# Patient Record
Sex: Female | Born: 1940 | Race: White | Hispanic: No | Marital: Married | Smoking: Former smoker
Health system: Southern US, Community
[De-identification: ages and names within clinical notes are randomized; demographics above are authoritative.]

## PROBLEM LIST (undated history)

## (undated) DIAGNOSIS — E7800 Pure hypercholesterolemia, unspecified: Secondary | ICD-10-CM

## (undated) DIAGNOSIS — I10 Essential (primary) hypertension: Secondary | ICD-10-CM

## (undated) DIAGNOSIS — E119 Type 2 diabetes mellitus without complications: Secondary | ICD-10-CM

## (undated) HISTORY — PX: ABDOMINAL HYSTERECTOMY: SHX81

## (undated) HISTORY — PX: TONSILLECTOMY: SUR1361

---

## 2015-09-09 DIAGNOSIS — H2513 Age-related nuclear cataract, bilateral: Secondary | ICD-10-CM | POA: Diagnosis not present

## 2015-09-09 DIAGNOSIS — E119 Type 2 diabetes mellitus without complications: Secondary | ICD-10-CM | POA: Diagnosis not present

## 2015-09-21 DIAGNOSIS — E119 Type 2 diabetes mellitus without complications: Secondary | ICD-10-CM | POA: Diagnosis not present

## 2015-09-21 DIAGNOSIS — I10 Essential (primary) hypertension: Secondary | ICD-10-CM | POA: Diagnosis not present

## 2015-09-21 DIAGNOSIS — E7800 Pure hypercholesterolemia, unspecified: Secondary | ICD-10-CM | POA: Diagnosis not present

## 2015-10-22 DIAGNOSIS — G4733 Obstructive sleep apnea (adult) (pediatric): Secondary | ICD-10-CM | POA: Diagnosis not present

## 2015-10-22 DIAGNOSIS — E7800 Pure hypercholesterolemia, unspecified: Secondary | ICD-10-CM | POA: Diagnosis not present

## 2015-10-22 DIAGNOSIS — Z298 Encounter for other specified prophylactic measures: Secondary | ICD-10-CM | POA: Diagnosis not present

## 2015-10-22 DIAGNOSIS — D696 Thrombocytopenia, unspecified: Secondary | ICD-10-CM | POA: Diagnosis not present

## 2015-10-22 DIAGNOSIS — I10 Essential (primary) hypertension: Secondary | ICD-10-CM | POA: Diagnosis not present

## 2015-10-22 DIAGNOSIS — E119 Type 2 diabetes mellitus without complications: Secondary | ICD-10-CM | POA: Diagnosis not present

## 2016-01-02 DIAGNOSIS — S93401A Sprain of unspecified ligament of right ankle, initial encounter: Secondary | ICD-10-CM | POA: Diagnosis not present

## 2016-01-08 DIAGNOSIS — D709 Neutropenia, unspecified: Secondary | ICD-10-CM | POA: Diagnosis not present

## 2016-01-08 DIAGNOSIS — Z7984 Long term (current) use of oral hypoglycemic drugs: Secondary | ICD-10-CM | POA: Diagnosis not present

## 2016-01-08 DIAGNOSIS — Z79899 Other long term (current) drug therapy: Secondary | ICD-10-CM | POA: Diagnosis not present

## 2016-01-08 DIAGNOSIS — D696 Thrombocytopenia, unspecified: Secondary | ICD-10-CM | POA: Diagnosis not present

## 2016-01-08 DIAGNOSIS — D693 Immune thrombocytopenic purpura: Secondary | ICD-10-CM | POA: Diagnosis not present

## 2016-01-26 DIAGNOSIS — N611 Abscess of the breast and nipple: Secondary | ICD-10-CM | POA: Diagnosis not present

## 2016-04-15 DIAGNOSIS — Z79899 Other long term (current) drug therapy: Secondary | ICD-10-CM | POA: Diagnosis not present

## 2016-04-15 DIAGNOSIS — D751 Secondary polycythemia: Secondary | ICD-10-CM | POA: Diagnosis not present

## 2016-04-15 DIAGNOSIS — J90 Pleural effusion, not elsewhere classified: Secondary | ICD-10-CM | POA: Diagnosis not present

## 2016-04-15 DIAGNOSIS — D693 Immune thrombocytopenic purpura: Secondary | ICD-10-CM | POA: Diagnosis not present

## 2016-04-15 DIAGNOSIS — Z7984 Long term (current) use of oral hypoglycemic drugs: Secondary | ICD-10-CM | POA: Diagnosis not present

## 2016-04-15 DIAGNOSIS — R600 Localized edema: Secondary | ICD-10-CM | POA: Diagnosis not present

## 2016-05-11 DIAGNOSIS — E119 Type 2 diabetes mellitus without complications: Secondary | ICD-10-CM | POA: Diagnosis not present

## 2016-05-11 DIAGNOSIS — R600 Localized edema: Secondary | ICD-10-CM | POA: Diagnosis not present

## 2016-05-11 DIAGNOSIS — I10 Essential (primary) hypertension: Secondary | ICD-10-CM | POA: Diagnosis not present

## 2016-05-11 DIAGNOSIS — D1801 Hemangioma of skin and subcutaneous tissue: Secondary | ICD-10-CM | POA: Diagnosis not present

## 2016-05-11 DIAGNOSIS — R0600 Dyspnea, unspecified: Secondary | ICD-10-CM | POA: Diagnosis not present

## 2016-05-14 DIAGNOSIS — J9811 Atelectasis: Secondary | ICD-10-CM | POA: Diagnosis not present

## 2016-05-14 DIAGNOSIS — R918 Other nonspecific abnormal finding of lung field: Secondary | ICD-10-CM | POA: Diagnosis not present

## 2016-06-10 DIAGNOSIS — L821 Other seborrheic keratosis: Secondary | ICD-10-CM | POA: Diagnosis not present

## 2016-06-10 DIAGNOSIS — D492 Neoplasm of unspecified behavior of bone, soft tissue, and skin: Secondary | ICD-10-CM | POA: Diagnosis not present

## 2016-06-10 DIAGNOSIS — L900 Lichen sclerosus et atrophicus: Secondary | ICD-10-CM | POA: Diagnosis not present

## 2016-07-05 DIAGNOSIS — L900 Lichen sclerosus et atrophicus: Secondary | ICD-10-CM | POA: Diagnosis not present

## 2016-07-23 DIAGNOSIS — Z79899 Other long term (current) drug therapy: Secondary | ICD-10-CM | POA: Diagnosis not present

## 2016-07-23 DIAGNOSIS — D693 Immune thrombocytopenic purpura: Secondary | ICD-10-CM | POA: Diagnosis not present

## 2016-07-23 DIAGNOSIS — D709 Neutropenia, unspecified: Secondary | ICD-10-CM | POA: Diagnosis not present

## 2016-07-23 DIAGNOSIS — D751 Secondary polycythemia: Secondary | ICD-10-CM | POA: Diagnosis not present

## 2016-07-23 DIAGNOSIS — R600 Localized edema: Secondary | ICD-10-CM | POA: Diagnosis not present

## 2016-07-23 DIAGNOSIS — Z7984 Long term (current) use of oral hypoglycemic drugs: Secondary | ICD-10-CM | POA: Diagnosis not present

## 2016-08-23 DIAGNOSIS — L900 Lichen sclerosus et atrophicus: Secondary | ICD-10-CM | POA: Diagnosis not present

## 2016-10-22 DIAGNOSIS — L304 Erythema intertrigo: Secondary | ICD-10-CM | POA: Diagnosis not present

## 2016-10-22 DIAGNOSIS — Z0000 Encounter for general adult medical examination without abnormal findings: Secondary | ICD-10-CM | POA: Diagnosis not present

## 2016-10-22 DIAGNOSIS — I10 Essential (primary) hypertension: Secondary | ICD-10-CM | POA: Diagnosis not present

## 2016-10-22 DIAGNOSIS — E7800 Pure hypercholesterolemia, unspecified: Secondary | ICD-10-CM | POA: Diagnosis not present

## 2016-10-22 DIAGNOSIS — D696 Thrombocytopenia, unspecified: Secondary | ICD-10-CM | POA: Diagnosis not present

## 2016-10-22 DIAGNOSIS — E119 Type 2 diabetes mellitus without complications: Secondary | ICD-10-CM | POA: Diagnosis not present

## 2016-10-22 DIAGNOSIS — G4733 Obstructive sleep apnea (adult) (pediatric): Secondary | ICD-10-CM | POA: Diagnosis not present

## 2016-11-11 DIAGNOSIS — H18413 Arcus senilis, bilateral: Secondary | ICD-10-CM | POA: Diagnosis not present

## 2016-11-11 DIAGNOSIS — H02839 Dermatochalasis of unspecified eye, unspecified eyelid: Secondary | ICD-10-CM | POA: Diagnosis not present

## 2016-11-11 DIAGNOSIS — H2512 Age-related nuclear cataract, left eye: Secondary | ICD-10-CM | POA: Diagnosis not present

## 2016-11-11 DIAGNOSIS — H2513 Age-related nuclear cataract, bilateral: Secondary | ICD-10-CM | POA: Diagnosis not present

## 2016-11-11 DIAGNOSIS — H40003 Preglaucoma, unspecified, bilateral: Secondary | ICD-10-CM | POA: Diagnosis not present

## 2016-11-11 DIAGNOSIS — I10 Essential (primary) hypertension: Secondary | ICD-10-CM | POA: Diagnosis not present

## 2016-12-10 DIAGNOSIS — L900 Lichen sclerosus et atrophicus: Secondary | ICD-10-CM | POA: Diagnosis not present

## 2016-12-17 DIAGNOSIS — Z1231 Encounter for screening mammogram for malignant neoplasm of breast: Secondary | ICD-10-CM | POA: Diagnosis not present

## 2017-01-04 DIAGNOSIS — L900 Lichen sclerosus et atrophicus: Secondary | ICD-10-CM | POA: Diagnosis not present

## 2017-01-04 DIAGNOSIS — L820 Inflamed seborrheic keratosis: Secondary | ICD-10-CM | POA: Diagnosis not present

## 2017-01-04 DIAGNOSIS — D485 Neoplasm of uncertain behavior of skin: Secondary | ICD-10-CM | POA: Diagnosis not present

## 2017-01-21 DIAGNOSIS — E119 Type 2 diabetes mellitus without complications: Secondary | ICD-10-CM | POA: Diagnosis not present

## 2017-02-01 DIAGNOSIS — L239 Allergic contact dermatitis, unspecified cause: Secondary | ICD-10-CM | POA: Diagnosis not present

## 2017-02-03 DIAGNOSIS — M545 Low back pain: Secondary | ICD-10-CM | POA: Diagnosis not present

## 2017-02-03 DIAGNOSIS — W19XXXA Unspecified fall, initial encounter: Secondary | ICD-10-CM | POA: Diagnosis not present

## 2017-02-03 DIAGNOSIS — E119 Type 2 diabetes mellitus without complications: Secondary | ICD-10-CM | POA: Diagnosis not present

## 2017-02-03 DIAGNOSIS — I10 Essential (primary) hypertension: Secondary | ICD-10-CM | POA: Diagnosis not present

## 2017-02-03 DIAGNOSIS — S0993XA Unspecified injury of face, initial encounter: Secondary | ICD-10-CM | POA: Diagnosis not present

## 2017-02-03 DIAGNOSIS — G319 Degenerative disease of nervous system, unspecified: Secondary | ICD-10-CM | POA: Diagnosis not present

## 2017-02-03 DIAGNOSIS — S0003XA Contusion of scalp, initial encounter: Secondary | ICD-10-CM | POA: Diagnosis not present

## 2017-02-03 DIAGNOSIS — S0990XA Unspecified injury of head, initial encounter: Secondary | ICD-10-CM | POA: Diagnosis not present

## 2017-02-03 DIAGNOSIS — S098XXA Other specified injuries of head, initial encounter: Secondary | ICD-10-CM | POA: Diagnosis not present

## 2017-02-03 DIAGNOSIS — Z882 Allergy status to sulfonamides status: Secondary | ICD-10-CM | POA: Diagnosis not present

## 2017-02-03 DIAGNOSIS — S300XXA Contusion of lower back and pelvis, initial encounter: Secondary | ICD-10-CM | POA: Diagnosis not present

## 2017-02-04 DIAGNOSIS — N611 Abscess of the breast and nipple: Secondary | ICD-10-CM | POA: Diagnosis not present

## 2017-02-04 DIAGNOSIS — D485 Neoplasm of uncertain behavior of skin: Secondary | ICD-10-CM | POA: Diagnosis not present

## 2017-02-09 DIAGNOSIS — M79671 Pain in right foot: Secondary | ICD-10-CM | POA: Diagnosis not present

## 2017-02-09 DIAGNOSIS — M25571 Pain in right ankle and joints of right foot: Secondary | ICD-10-CM | POA: Diagnosis not present

## 2017-02-14 DIAGNOSIS — K5730 Diverticulosis of large intestine without perforation or abscess without bleeding: Secondary | ICD-10-CM | POA: Diagnosis not present

## 2017-02-14 DIAGNOSIS — Z86010 Personal history of colonic polyps: Secondary | ICD-10-CM | POA: Diagnosis not present

## 2017-02-14 DIAGNOSIS — Z1211 Encounter for screening for malignant neoplasm of colon: Secondary | ICD-10-CM | POA: Diagnosis not present

## 2017-02-14 DIAGNOSIS — K648 Other hemorrhoids: Secondary | ICD-10-CM | POA: Diagnosis not present

## 2017-02-15 DIAGNOSIS — M659 Synovitis and tenosynovitis, unspecified: Secondary | ICD-10-CM | POA: Diagnosis not present

## 2017-02-15 DIAGNOSIS — Z6836 Body mass index (BMI) 36.0-36.9, adult: Secondary | ICD-10-CM | POA: Diagnosis not present

## 2017-02-15 DIAGNOSIS — R600 Localized edema: Secondary | ICD-10-CM | POA: Diagnosis not present

## 2017-02-15 DIAGNOSIS — M25571 Pain in right ankle and joints of right foot: Secondary | ICD-10-CM | POA: Diagnosis not present

## 2017-02-16 DIAGNOSIS — W010XXD Fall on same level from slipping, tripping and stumbling without subsequent striking against object, subsequent encounter: Secondary | ICD-10-CM | POA: Diagnosis not present

## 2017-02-16 DIAGNOSIS — S8991XD Unspecified injury of right lower leg, subsequent encounter: Secondary | ICD-10-CM | POA: Diagnosis not present

## 2017-02-16 DIAGNOSIS — D693 Immune thrombocytopenic purpura: Secondary | ICD-10-CM | POA: Diagnosis not present

## 2017-02-16 DIAGNOSIS — I10 Essential (primary) hypertension: Secondary | ICD-10-CM | POA: Diagnosis not present

## 2017-02-16 DIAGNOSIS — Z79899 Other long term (current) drug therapy: Secondary | ICD-10-CM | POA: Diagnosis not present

## 2017-02-16 DIAGNOSIS — Z6838 Body mass index (BMI) 38.0-38.9, adult: Secondary | ICD-10-CM | POA: Diagnosis not present

## 2017-02-16 DIAGNOSIS — S3992XD Unspecified injury of lower back, subsequent encounter: Secondary | ICD-10-CM | POA: Diagnosis not present

## 2017-02-16 DIAGNOSIS — Z7984 Long term (current) use of oral hypoglycemic drugs: Secondary | ICD-10-CM | POA: Diagnosis not present

## 2017-02-16 DIAGNOSIS — E119 Type 2 diabetes mellitus without complications: Secondary | ICD-10-CM | POA: Diagnosis not present

## 2017-02-17 DIAGNOSIS — L88 Pyoderma gangrenosum: Secondary | ICD-10-CM | POA: Diagnosis not present

## 2017-02-23 DIAGNOSIS — H2512 Age-related nuclear cataract, left eye: Secondary | ICD-10-CM | POA: Diagnosis not present

## 2017-02-23 DIAGNOSIS — E785 Hyperlipidemia, unspecified: Secondary | ICD-10-CM | POA: Diagnosis not present

## 2017-02-23 DIAGNOSIS — Z79899 Other long term (current) drug therapy: Secondary | ICD-10-CM | POA: Diagnosis not present

## 2017-02-23 DIAGNOSIS — I10 Essential (primary) hypertension: Secondary | ICD-10-CM | POA: Diagnosis not present

## 2017-02-23 DIAGNOSIS — E119 Type 2 diabetes mellitus without complications: Secondary | ICD-10-CM | POA: Diagnosis not present

## 2017-02-23 DIAGNOSIS — Z7984 Long term (current) use of oral hypoglycemic drugs: Secondary | ICD-10-CM | POA: Diagnosis not present

## 2017-02-23 DIAGNOSIS — Z87891 Personal history of nicotine dependence: Secondary | ICD-10-CM | POA: Diagnosis not present

## 2017-02-23 DIAGNOSIS — Z882 Allergy status to sulfonamides status: Secondary | ICD-10-CM | POA: Diagnosis not present

## 2017-02-24 DIAGNOSIS — M659 Synovitis and tenosynovitis, unspecified: Secondary | ICD-10-CM | POA: Diagnosis not present

## 2017-02-28 DIAGNOSIS — Z6838 Body mass index (BMI) 38.0-38.9, adult: Secondary | ICD-10-CM | POA: Diagnosis not present

## 2017-02-28 DIAGNOSIS — M659 Synovitis and tenosynovitis, unspecified: Secondary | ICD-10-CM | POA: Diagnosis not present

## 2017-03-14 DIAGNOSIS — M25471 Effusion, right ankle: Secondary | ICD-10-CM | POA: Diagnosis not present

## 2017-03-14 DIAGNOSIS — M659 Synovitis and tenosynovitis, unspecified: Secondary | ICD-10-CM | POA: Diagnosis not present

## 2017-03-14 DIAGNOSIS — R937 Abnormal findings on diagnostic imaging of other parts of musculoskeletal system: Secondary | ICD-10-CM | POA: Diagnosis not present

## 2017-03-14 DIAGNOSIS — M25472 Effusion, left ankle: Secondary | ICD-10-CM | POA: Diagnosis not present

## 2017-03-14 DIAGNOSIS — M65872 Other synovitis and tenosynovitis, left ankle and foot: Secondary | ICD-10-CM | POA: Diagnosis not present

## 2017-03-16 DIAGNOSIS — Z882 Allergy status to sulfonamides status: Secondary | ICD-10-CM | POA: Diagnosis not present

## 2017-03-16 DIAGNOSIS — H25011 Cortical age-related cataract, right eye: Secondary | ICD-10-CM | POA: Diagnosis not present

## 2017-03-16 DIAGNOSIS — Z9842 Cataract extraction status, left eye: Secondary | ICD-10-CM | POA: Diagnosis not present

## 2017-03-16 DIAGNOSIS — Z87891 Personal history of nicotine dependence: Secondary | ICD-10-CM | POA: Diagnosis not present

## 2017-03-16 DIAGNOSIS — H2511 Age-related nuclear cataract, right eye: Secondary | ICD-10-CM | POA: Diagnosis not present

## 2017-03-16 DIAGNOSIS — Z79899 Other long term (current) drug therapy: Secondary | ICD-10-CM | POA: Diagnosis not present

## 2017-03-16 DIAGNOSIS — Z7984 Long term (current) use of oral hypoglycemic drugs: Secondary | ICD-10-CM | POA: Diagnosis not present

## 2017-03-16 DIAGNOSIS — I10 Essential (primary) hypertension: Secondary | ICD-10-CM | POA: Diagnosis not present

## 2017-03-16 DIAGNOSIS — E119 Type 2 diabetes mellitus without complications: Secondary | ICD-10-CM | POA: Diagnosis not present

## 2017-03-16 DIAGNOSIS — Z961 Presence of intraocular lens: Secondary | ICD-10-CM | POA: Diagnosis not present

## 2017-03-16 DIAGNOSIS — E785 Hyperlipidemia, unspecified: Secondary | ICD-10-CM | POA: Diagnosis not present

## 2017-03-24 DIAGNOSIS — L900 Lichen sclerosus et atrophicus: Secondary | ICD-10-CM | POA: Diagnosis not present

## 2017-04-13 DIAGNOSIS — I10 Essential (primary) hypertension: Secondary | ICD-10-CM | POA: Diagnosis not present

## 2017-04-13 DIAGNOSIS — R0602 Shortness of breath: Secondary | ICD-10-CM | POA: Diagnosis not present

## 2017-04-13 DIAGNOSIS — R600 Localized edema: Secondary | ICD-10-CM | POA: Diagnosis not present

## 2017-04-25 DIAGNOSIS — R600 Localized edema: Secondary | ICD-10-CM | POA: Diagnosis not present

## 2017-04-25 DIAGNOSIS — E119 Type 2 diabetes mellitus without complications: Secondary | ICD-10-CM | POA: Diagnosis not present

## 2017-04-25 DIAGNOSIS — I10 Essential (primary) hypertension: Secondary | ICD-10-CM | POA: Diagnosis not present

## 2017-05-05 ENCOUNTER — Encounter (HOSPITAL_COMMUNITY): Payer: Self-pay | Admitting: Emergency Medicine

## 2017-05-05 ENCOUNTER — Inpatient Hospital Stay (HOSPITAL_COMMUNITY)
Admission: EM | Admit: 2017-05-05 | Discharge: 2017-05-12 | DRG: 291 | Disposition: A | Discharge status: Skilled Nursing Facility | Payer: PPO | Attending: Internal Medicine | Admitting: Internal Medicine

## 2017-05-05 ENCOUNTER — Emergency Department (HOSPITAL_COMMUNITY): Payer: PPO

## 2017-05-05 ENCOUNTER — Inpatient Hospital Stay (HOSPITAL_COMMUNITY): Payer: PPO

## 2017-05-05 DIAGNOSIS — R748 Abnormal levels of other serum enzymes: Secondary | ICD-10-CM

## 2017-05-05 DIAGNOSIS — Z87891 Personal history of nicotine dependence: Secondary | ICD-10-CM | POA: Diagnosis not present

## 2017-05-05 DIAGNOSIS — E119 Type 2 diabetes mellitus without complications: Secondary | ICD-10-CM

## 2017-05-05 DIAGNOSIS — E1169 Type 2 diabetes mellitus with other specified complication: Secondary | ICD-10-CM

## 2017-05-05 DIAGNOSIS — E7800 Pure hypercholesterolemia, unspecified: Secondary | ICD-10-CM | POA: Diagnosis present

## 2017-05-05 DIAGNOSIS — I5031 Acute diastolic (congestive) heart failure: Secondary | ICD-10-CM | POA: Diagnosis not present

## 2017-05-05 DIAGNOSIS — Z7984 Long term (current) use of oral hypoglycemic drugs: Secondary | ICD-10-CM

## 2017-05-05 DIAGNOSIS — J9600 Acute respiratory failure, unspecified whether with hypoxia or hypercapnia: Secondary | ICD-10-CM | POA: Diagnosis not present

## 2017-05-05 DIAGNOSIS — D693 Immune thrombocytopenic purpura: Secondary | ICD-10-CM | POA: Diagnosis present

## 2017-05-05 DIAGNOSIS — R791 Abnormal coagulation profile: Secondary | ICD-10-CM | POA: Diagnosis not present

## 2017-05-05 DIAGNOSIS — K766 Portal hypertension: Secondary | ICD-10-CM | POA: Diagnosis present

## 2017-05-05 DIAGNOSIS — J9601 Acute respiratory failure with hypoxia: Secondary | ICD-10-CM | POA: Diagnosis not present

## 2017-05-05 DIAGNOSIS — R0602 Shortness of breath: Secondary | ICD-10-CM | POA: Diagnosis not present

## 2017-05-05 DIAGNOSIS — E118 Type 2 diabetes mellitus with unspecified complications: Secondary | ICD-10-CM

## 2017-05-05 DIAGNOSIS — Z6837 Body mass index (BMI) 37.0-37.9, adult: Secondary | ICD-10-CM

## 2017-05-05 DIAGNOSIS — E876 Hypokalemia: Secondary | ICD-10-CM | POA: Diagnosis not present

## 2017-05-05 DIAGNOSIS — E669 Obesity, unspecified: Secondary | ICD-10-CM | POA: Diagnosis not present

## 2017-05-05 DIAGNOSIS — R778 Other specified abnormalities of plasma proteins: Secondary | ICD-10-CM

## 2017-05-05 DIAGNOSIS — I152 Hypertension secondary to endocrine disorders: Secondary | ICD-10-CM

## 2017-05-05 DIAGNOSIS — I700 Atherosclerosis of aorta: Secondary | ICD-10-CM | POA: Diagnosis not present

## 2017-05-05 DIAGNOSIS — I509 Heart failure, unspecified: Secondary | ICD-10-CM | POA: Diagnosis not present

## 2017-05-05 DIAGNOSIS — K7460 Unspecified cirrhosis of liver: Secondary | ICD-10-CM | POA: Diagnosis present

## 2017-05-05 DIAGNOSIS — E1159 Type 2 diabetes mellitus with other circulatory complications: Secondary | ICD-10-CM

## 2017-05-05 DIAGNOSIS — Z79899 Other long term (current) drug therapy: Secondary | ICD-10-CM | POA: Diagnosis not present

## 2017-05-05 DIAGNOSIS — R7989 Other specified abnormal findings of blood chemistry: Secondary | ICD-10-CM | POA: Diagnosis present

## 2017-05-05 DIAGNOSIS — R0902 Hypoxemia: Secondary | ICD-10-CM | POA: Diagnosis not present

## 2017-05-05 DIAGNOSIS — R188 Other ascites: Secondary | ICD-10-CM | POA: Diagnosis not present

## 2017-05-05 DIAGNOSIS — R278 Other lack of coordination: Secondary | ICD-10-CM | POA: Diagnosis not present

## 2017-05-05 DIAGNOSIS — I110 Hypertensive heart disease with heart failure: Secondary | ICD-10-CM | POA: Diagnosis not present

## 2017-05-05 DIAGNOSIS — K7290 Hepatic failure, unspecified without coma: Secondary | ICD-10-CM | POA: Diagnosis not present

## 2017-05-05 DIAGNOSIS — I10 Essential (primary) hypertension: Secondary | ICD-10-CM | POA: Diagnosis not present

## 2017-05-05 DIAGNOSIS — R609 Edema, unspecified: Secondary | ICD-10-CM

## 2017-05-05 DIAGNOSIS — I5040 Unspecified combined systolic (congestive) and diastolic (congestive) heart failure: Secondary | ICD-10-CM | POA: Diagnosis not present

## 2017-05-05 DIAGNOSIS — K760 Fatty (change of) liver, not elsewhere classified: Secondary | ICD-10-CM | POA: Diagnosis present

## 2017-05-05 DIAGNOSIS — R601 Generalized edema: Secondary | ICD-10-CM | POA: Diagnosis not present

## 2017-05-05 DIAGNOSIS — I5033 Acute on chronic diastolic (congestive) heart failure: Secondary | ICD-10-CM | POA: Diagnosis not present

## 2017-05-05 DIAGNOSIS — R2681 Unsteadiness on feet: Secondary | ICD-10-CM | POA: Diagnosis not present

## 2017-05-05 DIAGNOSIS — M6281 Muscle weakness (generalized): Secondary | ICD-10-CM | POA: Diagnosis not present

## 2017-05-05 HISTORY — DX: Pure hypercholesterolemia, unspecified: E78.00

## 2017-05-05 HISTORY — DX: Essential (primary) hypertension: I10

## 2017-05-05 HISTORY — DX: Type 2 diabetes mellitus without complications: E11.9

## 2017-05-05 LAB — CBC WITH DIFFERENTIAL/PLATELET
Basophils Absolute: 0.1 K/uL (ref 0.0–0.1)
Basophils Relative: 1 %
Eosinophils Absolute: 0.2 K/uL (ref 0.0–0.7)
Eosinophils Relative: 3 %
HCT: 44.6 % (ref 36.0–46.0)
Hemoglobin: 15.5 g/dL — ABNORMAL HIGH (ref 12.0–15.0)
Lymphocytes Relative: 22 %
Lymphs Abs: 1.5 K/uL (ref 0.7–4.0)
MCH: 36.4 pg — ABNORMAL HIGH (ref 26.0–34.0)
MCHC: 34.8 g/dL (ref 30.0–36.0)
MCV: 104.7 fL — ABNORMAL HIGH (ref 78.0–100.0)
Monocytes Absolute: 1 K/uL (ref 0.1–1.0)
Monocytes Relative: 15 %
Neutro Abs: 3.8 K/uL (ref 1.7–7.7)
Neutrophils Relative %: 59 %
Platelets: 91 K/uL — ABNORMAL LOW (ref 150–400)
RBC: 4.26 MIL/uL (ref 3.87–5.11)
RDW: 16.8 % — ABNORMAL HIGH (ref 11.5–15.5)
WBC: 6.6 K/uL (ref 4.0–10.5)

## 2017-05-05 LAB — TROPONIN I: Troponin I: 0.04 ng/mL

## 2017-05-05 LAB — COMPREHENSIVE METABOLIC PANEL WITH GFR
ALT: 59 U/L — ABNORMAL HIGH (ref 14–54)
AST: 87 U/L — ABNORMAL HIGH (ref 15–41)
Albumin: 2.9 g/dL — ABNORMAL LOW (ref 3.5–5.0)
Alkaline Phosphatase: 81 U/L (ref 38–126)
Anion gap: 10 (ref 5–15)
BUN: 12 mg/dL (ref 6–20)
CO2: 28 mmol/L (ref 22–32)
Calcium: 8.9 mg/dL (ref 8.9–10.3)
Chloride: 96 mmol/L — ABNORMAL LOW (ref 101–111)
Creatinine, Ser: 0.7 mg/dL (ref 0.44–1.00)
GFR calc Af Amer: >60 mL/min
GFR calc non Af Amer: >60 mL/min
Glucose, Bld: 131 mg/dL — ABNORMAL HIGH (ref 65–99)
Potassium: 3.9 mmol/L (ref 3.5–5.1)
Sodium: 134 mmol/L — ABNORMAL LOW (ref 135–145)
Total Bilirubin: 2.3 mg/dL — ABNORMAL HIGH (ref 0.3–1.2)
Total Protein: 7.2 g/dL (ref 6.5–8.1)

## 2017-05-05 LAB — I-STAT BETA HCG BLOOD, ED (MC, WL, AP ONLY): I-stat hCG, quantitative: <5 m[IU]/mL (ref <5)

## 2017-05-05 LAB — BRAIN NATRIURETIC PEPTIDE: B NATRIURETIC PEPTIDE 5: 43.1 pg/mL (ref 0.0–100.0)

## 2017-05-05 LAB — D-DIMER, QUANTITATIVE (NOT AT ARMC): D DIMER QUANT: 11.49 ug{FEU}/mL — AB (ref 0.00–0.50)

## 2017-05-05 MED ORDER — ASPIRIN 81 MG PO CHEW
324.0000 mg | CHEWABLE_TABLET | Freq: Once | ORAL | Status: AC
  Administered 2017-05-05: 324 mg via ORAL
  Filled 2017-05-05: qty 4

## 2017-05-05 MED ORDER — FUROSEMIDE 10 MG/ML IJ SOLN
40.0000 mg | Freq: Two times a day (BID) | INTRAMUSCULAR | Status: DC
  Administered 2017-05-05 – 2017-05-09 (×8): 40 mg via INTRAVENOUS
  Filled 2017-05-05 (×7): qty 4

## 2017-05-05 MED ORDER — IOPAMIDOL (ISOVUE-370) INJECTION 76%
INTRAVENOUS | Status: AC
  Administered 2017-05-05: 100 mL
  Filled 2017-05-05: qty 100

## 2017-05-05 NOTE — ED Notes (Signed)
ED Provider at bedside. 

## 2017-05-05 NOTE — ED Notes (Signed)
Patient transported to CT 

## 2017-05-05 NOTE — ED Triage Notes (Signed)
Patient c/o SOB with exertion and bilat leg swelling for 2 weeks.  Patient denies any CHF PMH.

## 2017-05-05 NOTE — H&P (Addendum)
History and Physical    Brittany Reid BOF:[id number] DOB: 1941-04-01 DOA: 05/05/2017  I have briefly reviewed the patient's prior medical records in [place]  PCP: Drake Leach, MD  Patient coming from: home  Chief Complaint: Leg swelling/shortness of breath  HPI: Brittany Reid is a 76 y.o. female with medical history significant of hypertension, diabetes mellitus on oral hypoglycemics, chronic ITP, who presents to the emergency room with chief complaint of leg swelling and progressive shortness of breath over the last 2 weeks.  Patient denies having symptoms like this in the past.  She states that she gained about 10 pounds over a short period of time, and she has found herself being able to do fewer and fewer things without getting short of breath.  She denies any chest pain or palpitations.  She denies any history of heart failure or cardiac disease.  She also has noticed progressive abdominal swelling over the same period of time.  She denies any history of liver disease.  She denies any cough, fever or chills.  She has no abdominal pain, no nausea vomiting or diarrhea.  ED Course: In the emergency room, patient's vital signs are stable, she is afebrile, normotensive, was desatting on room air requiring oxygen supplementation.  Blood work reveals slightly elevated LFTs with a total bilirubin of 2.3, AST 87, ALT 59.  It also showed a normal BNP of 43.1.  Troponin is borderline at 0.04.  Platelets are low at 91.  TRH is asked for admission for concern for CHF, new onset  Review of Systems: As per HPI otherwise 10 point review of systems negative.   Past Medical History:  Diagnosis Date  . Diabetes mellitus without complication ([place])   . High cholesterol   . Hypertension     Past Surgical History:  Procedure Laterality Date  . ABDOMINAL HYSTERECTOMY    . TONSILLECTOMY       reports that she has quit smoking. She has never used smokeless tobacco. She reports that she does not  drink alcohol. Her drug history is not on file.  Allergies  Allergen Reactions  . Sulfur Diarrhea and Nausea And Vomiting   Family history Reviewed, no pertinent past family history  Prior to Admission medications   Medication Sig Start Date End Date Taking? Authorizing Provider  amitriptyline (ELAVIL) 25 MG tablet Take 50 mg by mouth at bedtime.   Yes [provider]  hydrochlorothiazide (HYDRODIURIL) 25 MG tablet Take 25 mg by mouth daily.   Yes [provider]  metFORMIN (GLUCOPHAGE-XR) 500 MG 24 hr tablet Take 1,000 mg by mouth daily.   Yes [provider]  metoprolol tartrate (LOPRESSOR) 50 MG tablet Take 25 mg by mouth 2 (two) times daily.   Yes [provider]  simvastatin (ZOCOR) 20 MG tablet Take 20 mg by mouth daily.   Yes [provider]    Physical Exam: Vitals:   05/05/17 1220 05/05/17 1301 05/05/17 1400 05/05/17 1444  BP:  (!) 145/73 135/74 126/63  Pulse:  95 93 93  Resp:  16 18 16   Temp:      TempSrc:      SpO2: 94% 92% 93% 92%  Weight:      Height:          Constitutional: NAD, calm, comfortable Eyes: PERRL, lids and conjunctivae normal ENMT: Mucous membranes are moist.   Neck: normal, supple,  Respiratory: Moves air well, no wheezing, faint bibasilar crackles.  Normal respiratory effort.  No accessory muscle use Cardiovascular: Regular rate and rhythm, no murmurs / rubs / gallops.  2+ pitting extremity edema. 2+ pedal pulses.  Abdomen: Obese, slightly distended, no tenderness, no masses palpated. Bowel sounds positive.  Musculoskeletal: no clubbing / cyanosis. Normal muscle tone.  Skin: no rashes, lesions, ulcers. No induration Neurologic: CN 2-12 grossly intact. Strength 5/5 in all 4.  Psychiatric: Normal judgment and insight. Alert and oriented x 3. Normal mood.   Labs on Admission: I have personally reviewed following labs and imaging studies  CBC:  Recent Labs Lab 05/05/17 1256  WBC 6.6  NEUTROABS  3.8  HGB 15.5*  HCT 44.6  MCV 104.7*  PLT 91*   Basic Metabolic Panel:  Recent Labs Lab 05/05/17 1256  NA 134*  K 3.9  CL 96*  CO2 28  GLUCOSE 131*  BUN 12  CREATININE 0.70  CALCIUM 8.9   GFR: Estimated Creatinine Clearance: 69.7 mL/min (by C-G formula based on SCr of 0.7 mg/dL). Liver Function Tests:  Recent Labs Lab 05/05/17 1256  AST 87*  ALT 59*  ALKPHOS 81  BILITOT 2.3*  PROT 7.2  ALBUMIN 2.9*   No results for input(s): LIPASE, AMYLASE in the last 168 hours. No results for input(s): AMMONIA in the last 168 hours. Coagulation Profile: No results for input(s): INR, PROTIME in the last 168 hours. Cardiac Enzymes:  Recent Labs Lab 05/05/17 1256  TROPONINI 0.04*   BNP (last 3 results) No results for input(s): PROBNP in the last 8760 hours. HbA1C: No results for input(s): HGBA1C in the last 72 hours. CBG: No results for input(s): GLUCAP in the last 168 hours. Lipid Profile: No results for input(s): CHOL, HDL, LDLCALC, TRIG, CHOLHDL, LDLDIRECT in the last 72 hours. Thyroid Function Tests: No results for input(s): TSH, T4TOTAL, FREET4, T3FREE, THYROIDAB in the last 72 hours. Anemia Panel: No results for input(s): VITAMINB12, FOLATE, FERRITIN, TIBC, IRON, RETICCTPCT in the last 72 hours. Urine analysis: No results found for: COLORURINE, APPEARANCEUR, [place], [place], GLUCOSEU, [place], BILIRUBINUR, KETONESUR, PROTEINUR, UROBILINOGEN, NITRITE, LEUKOCYTESUR   Radiological Exams on Admission: Dg Chest 2 View  Result Date: 05/05/2017 CLINICAL DATA:  Shortness of breath with exertion, BILATERAL leg swelling for 2 weeks, former smoker, diabetes mellitus, hypertension EXAM: CHEST  2 VIEW COMPARISON:  05/14/2016 FINDINGS: Upper normal heart size. Mediastinal contours and pulmonary vascularity normal. Bibasilar atelectasis with decreased lung volumes versus previous study. Atherosclerotic calcification aortic arch. No definite infiltrate, pleural effusion or  pneumothorax. Bones demineralized with scattered degenerative disc disease changes of the thoracic spine and BILATERAL AC joint degenerative changes. IMPRESSION: Decreased lung volumes with bibasilar atelectasis. Electronically Signed   By: Lavonia Dana M.D.   On: 05/05/2017 13:40    EKG: Independently reviewed.  Sinus rhythm  Assessment/Plan Active Problems:   CHF (congestive heart failure) (HCC)   Diabetes mellitus (HCC)   HTN (hypertension)   Obesity, diabetes, and hypertension syndrome (HCC)   Chronic ITP (idiopathic thrombocytopenia) (HCC)   Elevated liver enzymes   Acute CHF /anasarca -Patient clinically looks fluid overloaded with bilateral lower extremity pitting edema, abdominal swelling suggesting ascites -Admit on heart failure pathway, start IV Lasix 40 mg every 12 hours, strict ins and outs, daily weights -Continue home beta-blocker, hold ACE inhibitor for now until we know more why she is so fluid overloaded  Acute hypoxic respiratory failure -PE is on differential for hypoxia/shortness of breath as her CXR wasn't that obvious for pulmonary edema.  D dimer extremely elevated, obtain CT angio  Elevated  liver enzymes -Patient has slightly elevation in her AST/ALT/bilirubin, her albumin is slightly low and she is thrombocytopenic.  Concern for undiagnosed liver disease, obtain right upper quadrant ultrasound  Chronic ITP -Followed by hematology as an outpatient, I do not see prior platelet values.  If she has liver disease may also contribute to thrombocytopenia  Type 2 diabetes mellitus -Hold metformin for now, place patient on sliding scale  Hypertension -Resume home metoprolol   DVT prophylaxis: Lovenox Code Status: Full code Family Communication: Husband and daughter at bedside Disposition Plan: Admit to telemetry, likely home when ready Consults called: None    Admission status: Inpatient  At the time of admission, it appears that the appropriate admission  status for this patient is INPATIENT. This is judged to be reasonable and necessary in order to provide the required high service intensity to ensure the patient's safety given the presenting symptoms, physical exam findings, and initial radiographic and laboratory data in the context of their chronic comorbidities. Current circumstances are significant fluid overload due to probable CHF, requiring IV diuresis, and it is felt to place patient at high risk for further clinical deterioration threatening life, limb, or organ. Moreover, it is my clinical judgment that the patient will require inpatient hospital care spanning beyond 2 midnights from the point of admission and that early discharge would result in unnecessary risk of decompensation and readmission or threat to life, limb or bodily function.   Marzetta Board, MD [facility] Pager [phone]  If 7PM-7AM, please contact night-coverage [email or web address] Password [email or web address]  05/05/2017, 4:24 PM

## 2017-05-05 NOTE — ED Notes (Signed)
Date and time results received: 05/05/17  (use smartphrase ".now" to insert current time  Test: d -dimer Critical Value: 11.49  Name of Provider Notified: Hospitalist  Orders Received? Or Actions Taken?: see orders

## 2017-05-05 NOTE — ED Notes (Signed)
HOSPITALIST Provider at bedside. 

## 2017-05-05 NOTE — ED Provider Notes (Signed)
[facility] DEPT Provider Note   CSN: [id number] Arrival date & time: 05/05/17  1201     History   Chief Complaint Chief Complaint  Patient presents with  . Shortness of Breath  . Leg Swelling    HPI Brittany Reid Current is a 76 y.o. female.  HPI   76 year old female with a history of diabetes, hypertension and hyperlipidemia presents with concern for progressive dyspnea over 3 weeks and bilateral leg swelling.  Reports that it's been progressive over the last couple weeks. Reports orthopnea where she requires 2 pillows to sleep. Reports the swelling and shortness of breath and continued to increase. Reports she now feels short of breath at rest. Reports chest tightness for the last 2 weeks, that comes intermittently, is worse with exertion. Denies any chest tightness at this time. Denies associated nausea or sweating. Denies fevers. Had dry cough.  Reports that she saw her primary care physician's PA and was told that she did not have congestive heart failure based off of lab work, and was given Lasix.  She and her husband are concerned this could be CHF. Has no history of same.  No hx of lung disease or O2 requirement.  Past Medical History:  Diagnosis Date  . Diabetes mellitus without complication ([place])   . High cholesterol   . Hypertension     Patient Active Problem List   Diagnosis Date Noted  . CHF (congestive heart failure) ([place]) 05/05/2017  . Diabetes mellitus ([place]) 05/05/2017  . HTN (hypertension) 05/05/2017  . Obesity, diabetes, and hypertension syndrome ([place]) 05/05/2017  . Chronic ITP (idiopathic thrombocytopenia) (HCC) 05/05/2017  . Elevated liver enzymes 05/05/2017  . Respiratory failure with hypoxia ([place]) 05/05/2017    Past Surgical History:  Procedure Laterality Date  . ABDOMINAL HYSTERECTOMY    . TONSILLECTOMY      OB History    No data available       Home Medications    Prior to Admission medications   Medication Sig Start Date End Date Taking?  Authorizing Provider  amitriptyline (ELAVIL) 25 MG tablet Take 50 mg by mouth at bedtime.   Yes [provider]  hydrochlorothiazide (HYDRODIURIL) 25 MG tablet Take 25 mg by mouth daily.   Yes [provider]  metFORMIN (GLUCOPHAGE-XR) 500 MG 24 hr tablet Take 1,000 mg by mouth daily.   Yes [provider]  metoprolol tartrate (LOPRESSOR) 50 MG tablet Take 25 mg by mouth 2 (two) times daily.   Yes [provider]  simvastatin (ZOCOR) 20 MG tablet Take 20 mg by mouth daily.   Yes [provider]    Family History History reviewed. No pertinent family history.  Social History Social History  Substance Use Topics  . Smoking status: Former Research scientist (life sciences)  . Smokeless tobacco: Never Used  . Alcohol use No     Allergies   Sulfur   Review of Systems Review of Systems  Constitutional: Negative for fever.  HENT: Negative for sore throat.   Eyes: Negative for visual disturbance.  Respiratory: Positive for chest tightness and shortness of breath. Negative for cough.   Cardiovascular: Positive for chest pain (tightness) and leg swelling. Negative for palpitations.  Gastrointestinal: Negative for abdominal pain, diarrhea, nausea and vomiting.  Genitourinary: Negative for difficulty urinating.  Musculoskeletal: Negative for back pain and neck pain.  Skin: Negative for rash.  Neurological: Negative for syncope, light-headedness and headaches.     Physical Exam Updated Vital Signs BP (!) 146/68   Pulse 93  Temp 98.2 F (36.8 C) (Oral)   Resp 18   Ht 5\' 4"  (1.626 m)   Wt 99.8 kg (220 lb)   SpO2 92%   BMI 37.76 kg/m   Physical Exam  Constitutional: She is oriented to person, place, and time. She appears well-developed and well-nourished. No distress.  HENT:  Head: Normocephalic and atraumatic.  Eyes: Conjunctivae and EOM are normal.  Neck: Normal range of motion. JVD present.  Cardiovascular: Normal rate, regular rhythm, normal heart  sounds and intact distal pulses.  Exam reveals no gallop and no friction rub.   No murmur heard. Pulmonary/Chest: Effort normal. No respiratory distress. She has no wheezes. She has rales (occasional bases bilaterally).  Diminished at bases  Abdominal: Soft. She exhibits distension and ascites. There is no tenderness. There is no guarding.  Musculoskeletal: She exhibits edema. She exhibits no tenderness.  Neurological: She is alert and oriented to person, place, and time.  Skin: Skin is warm and dry. No rash noted. She is not diaphoretic. No erythema.  Nursing note and vitals reviewed.    ED Treatments / Results  Labs (all labs ordered are listed, but only abnormal results are displayed) Labs Reviewed  CBC WITH DIFFERENTIAL/PLATELET - Abnormal; Notable for the following:       Result Value   Hemoglobin 15.5 (*)    MCV 104.7 (*)    MCH 36.4 (*)    RDW 16.8 (*)    Platelets 91 (*)    All other components within normal limits  COMPREHENSIVE METABOLIC PANEL - Abnormal; Notable for the following:    Sodium 134 (*)    Chloride 96 (*)    Glucose, Bld 131 (*)    Albumin 2.9 (*)    AST 87 (*)    ALT 59 (*)    Total Bilirubin 2.3 (*)    All other components within normal limits  TROPONIN I - Abnormal; Notable for the following:    Troponin I 0.04 (*)    All other components within normal limits  D-DIMER, QUANTITATIVE (NOT AT [facility]) - Abnormal; Notable for the following:    D-Dimer, Quant 11.49 (*)    All other components within normal limits  BRAIN NATRIURETIC PEPTIDE  I-STAT TROPONIN, ED  I-STAT BETA HCG BLOOD, ED ([facility], [facility], [facility] ONLY)    EKG  EKG Interpretation  Date/Time:  Thursday May 05 2017 12:12:14 EDT Ventricular Rate:  99 PR Interval:    QRS Duration: 100 QT Interval:  360 QTC Calculation: 462 R Axis:   -31 Text Interpretation:  Sinus rhythm Left ventricular hypertrophy Anterolateral infarct, old No significant change since last tracing Confirmed by Gareth Morgan  [phone]) on 05/05/2017 1:28:46 PM       Radiology Dg Chest 2 View  Result Date: 05/05/2017 CLINICAL DATA:  Shortness of breath with exertion, BILATERAL leg swelling for 2 weeks, former smoker, diabetes mellitus, hypertension EXAM: CHEST  2 VIEW COMPARISON:  05/14/2016 FINDINGS: Upper normal heart size. Mediastinal contours and pulmonary vascularity normal. Bibasilar atelectasis with decreased lung volumes versus previous study. Atherosclerotic calcification aortic arch. No definite infiltrate, pleural effusion or pneumothorax. Bones demineralized with scattered degenerative disc disease changes of the thoracic spine and BILATERAL AC joint degenerative changes. IMPRESSION: Decreased lung volumes with bibasilar atelectasis. Electronically Signed   By: Lavonia Dana M.D.   On: 05/05/2017 13:40    Procedures Procedures (including critical care time)  Medications Ordered in ED Medications  furosemide (LASIX) injection 40 mg (not administered)  aspirin chewable  tablet 324 mg (324 mg Oral Given 05/05/17 1724)  iopamidol (ISOVUE-370) 76 % injection (100 mLs  Contrast Given 05/05/17 1836)     Initial Impression / Assessment and Plan / ED Course  I have reviewed the triage vital signs and the nursing notes.  Pertinent labs & imaging results that were available during my care of the patient were reviewed by me and considered in my medical decision making (see chart for details).     76 year old female with a history of diabetes, hypertension and hyperlipidemia presents with concern for progressive dyspnea over 3 weeks and bilateral leg swelling and hypoxia on arrival to 86% on room air. Clinically on exam, have high suspicion for congestive heart failure. Chest x-ray shows borderline cardiomegaly. Labs are significant for for thrombocytopenia, hypoalbuminemia, mild transaminitis and hyperbilirubinemia. Bedside ultrasound shows ascites present. Have concern for undiagnosed cirrhosis as possible etiology  of edema as well.  Troponin elevated to .04, indolent course and overall doubt ACS given no active chest pain but feel this is likely secondary to strain.    Discussed with hospitalist given patient's hypoxia, clinical concern for CHF/ascites/suspect undiagnosed cirrhosis --discussed possibiltiy of obtaining CT imaging of C/A/P to evaluate for PE or other liver pathology. Agree to obtain ddimer for further evaluation, CTA if positive given pt not high risk wells.  Will admit for continued evaluation, diuresis, [place].    Final Clinical Impressions(s) / ED Diagnoses   Final diagnoses:  Anasarca  Peripheral edema  Hypoxia  Other ascites  Elevated troponin    New Prescriptions New Prescriptions   No medications on file     Gareth Morgan, MD 05/05/17 [phone]

## 2017-05-06 ENCOUNTER — Inpatient Hospital Stay (HOSPITAL_COMMUNITY): Payer: PPO

## 2017-05-06 DIAGNOSIS — I509 Heart failure, unspecified: Secondary | ICD-10-CM

## 2017-05-06 LAB — URINALYSIS, COMPLETE (UACMP) WITH MICROSCOPIC
Bilirubin Urine: NEGATIVE
Glucose, UA: NEGATIVE mg/dL
Hgb urine dipstick: NEGATIVE
KETONES UR: NEGATIVE mg/dL
Nitrite: NEGATIVE
PROTEIN: NEGATIVE mg/dL
Specific Gravity, Urine: 1.017 (ref 1.005–1.030)
pH: 7 (ref 5.0–8.0)

## 2017-05-06 LAB — CBG MONITORING, ED
GLUCOSE-CAPILLARY: 91 mg/dL (ref 65–99)
Glucose-Capillary: 122 mg/dL — ABNORMAL HIGH (ref 65–99)

## 2017-05-06 LAB — GLUCOSE, CAPILLARY
GLUCOSE-CAPILLARY: 123 mg/dL — AB (ref 65–99)
Glucose-Capillary: 94 mg/dL (ref 65–99)

## 2017-05-06 MED ORDER — ONDANSETRON HCL 4 MG/2ML IJ SOLN: 4.0000 mg | Freq: Four times a day (QID) | INTRAMUSCULAR | Status: DC | PRN

## 2017-05-06 MED ORDER — SODIUM CHLORIDE 0.9% FLUSH
3.0000 mL | Freq: Two times a day (BID) | INTRAVENOUS | Status: DC
  Administered 2017-05-06 – 2017-05-11 (×10): 3 mL via INTRAVENOUS

## 2017-05-06 MED ORDER — INSULIN ASPART 100 UNIT/ML ~~LOC~~ SOLN
0.0000 [IU] | Freq: Three times a day (TID) | SUBCUTANEOUS | Status: DC
Start: 2017-05-06 — End: 2017-05-12
  Administered 2017-05-07 (×2): 1 [IU] via SUBCUTANEOUS
  Administered 2017-05-07: 3 [IU] via SUBCUTANEOUS
  Administered 2017-05-08 – 2017-05-09 (×4): 1 [IU] via SUBCUTANEOUS
  Administered 2017-05-10: 2 [IU] via SUBCUTANEOUS
  Administered 2017-05-10 – 2017-05-12 (×4): 1 [IU] via SUBCUTANEOUS

## 2017-05-06 MED ORDER — ENOXAPARIN SODIUM 40 MG/0.4ML ~~LOC~~ SOLN
40.0000 mg | SUBCUTANEOUS | Status: DC
  Administered 2017-05-06 – 2017-05-12 (×7): 40 mg via SUBCUTANEOUS
  Filled 2017-05-06 (×7): qty 0.4

## 2017-05-06 MED ORDER — SODIUM CHLORIDE 0.9% FLUSH
3.0000 mL | INTRAVENOUS | Status: DC | PRN
Start: 2017-05-06 — End: 2017-05-12

## 2017-05-06 MED ORDER — PERFLUTREN LIPID MICROSPHERE
1.0000 mL | INTRAVENOUS | Status: AC | PRN
  Administered 2017-05-06: 5 mL via INTRAVENOUS
  Filled 2017-05-06: qty 10

## 2017-05-06 MED ORDER — ASPIRIN EC 81 MG PO TBEC
81.0000 mg | DELAYED_RELEASE_TABLET | Freq: Every day | ORAL | Status: DC
  Administered 2017-05-06 – 2017-05-12 (×7): 81 mg via ORAL
  Filled 2017-05-06 (×7): qty 1

## 2017-05-06 MED ORDER — METOPROLOL TARTRATE 25 MG PO TABS
25.0000 mg | ORAL_TABLET | Freq: Two times a day (BID) | ORAL | Status: DC
  Administered 2017-05-06 – 2017-05-12 (×13): 25 mg via ORAL
  Filled 2017-05-06 (×13): qty 1

## 2017-05-06 MED ORDER — ACETAMINOPHEN 325 MG PO TABS
650.0000 mg | ORAL_TABLET | ORAL | Status: DC | PRN
  Filled 2017-05-06: qty 2

## 2017-05-06 MED ORDER — AMITRIPTYLINE HCL 25 MG PO TABS
50.0000 mg | ORAL_TABLET | Freq: Every day | ORAL | Status: DC
  Administered 2017-05-06 – 2017-05-11 (×6): 50 mg via ORAL
  Filled 2017-05-06 (×6): qty 2

## 2017-05-06 MED ORDER — SIMVASTATIN 20 MG PO TABS
20.0000 mg | ORAL_TABLET | Freq: Every day | ORAL | Status: DC
  Administered 2017-05-06 – 2017-05-11 (×6): 20 mg via ORAL
  Filled 2017-05-06 (×7): qty 1

## 2017-05-06 MED ORDER — SODIUM CHLORIDE 0.9 % IV SOLN: 250.0000 mL | INTRAVENOUS | Status: DC | PRN

## 2017-05-06 NOTE — Progress Notes (Signed)
  Echocardiogram 2D Echocardiogram with definity has been performed.  Darlina Sicilian M 05/06/2017, 3:07 PM

## 2017-05-06 NOTE — Progress Notes (Signed)
Please call Caryl Pina at [phone] at [phone] with report. Andre Lefort

## 2017-05-06 NOTE — Progress Notes (Signed)
   05/06/17 2333  What Happened  Was fall witnessed? Yes  Who witnessed fall? Denies, NT  Patients activity before fall to/from bed, chair, or stretcher  Point of contact buttocks;hip/leg  Was patient injured? No  Follow Up  MD notified Jeannette Corpus  Time MD notified 2322  Family notified No- patient refusal  Progress note created (see row info) Yes  Adult Fall Risk Assessment  Risk Factor Category (scoring not indicated) High fall risk per protocol (document High fall risk)  Age 76  Fall History: Fall within 6 months prior to admission 5  Elimination; Bowel and/or Urine Incontinence 2  Elimination; Bowel and/or Urine Urgency/Frequency 0  Medications: includes PCA/Opiates, Anti-convulsants, Anti-hypertensives, Diuretics, Hypnotics, Laxatives, Sedatives, and Psychotropics 3  Patient Care Equipment 0  Mobility-Assistance 2  Mobility-Gait 2  Mobility-Sensory Deficit 0  Altered awareness of immediate physical environment 0  Impulsiveness 0  Lack of understanding of one's physical/cognitive limitations 0  Total Score 16  Adult Fall Risk Interventions  Required Bundle Interventions *See Row Information* High fall risk - low, moderate, and high requirements implemented  Additional Interventions Use of appropriate toileting equipment (bedpan, BSC, etc.);Room near nurses station  Screening for Fall Injury Risk  Risk For Fall Injury- See Row Information  None identified  Vitals  Temp 98.3 F (36.8 C)  Temp Source Oral  BP (!) 128/101  BP Location Left Arm  BP Method Automatic  Patient Position (if appropriate) Sitting  Pulse Rate (!) 122  Pulse Rate Source [place]  Resp (!) 22  Oxygen Therapy  SpO2 95 %  O2 Device Nasal Cannula  O2 Flow Rate (L/min) 2 L/min  Pain Assessment  Pain Assessment No/denies pain  PCA/Epidural/Spinal Assessment  Respiratory Pattern Regular;Unlabored  Neurological  Neuro (WDL) WDL  Neuro Additional Assessments No  Musculoskeletal  Musculoskeletal  (WDL) X  Assistive Device Standard walker  Generalized Weakness Yes  Weight Bearing Restrictions No  Integumentary  Integumentary (WDL) X  Skin Color Appropriate for ethnicity  Skin Condition Dry  Skin Integrity Ecchymosis  Ecchymosis Location Arm  Ecchymosis Location Orientation Right;Left  Ecchymosis Intervention Other (Comment) (Assessed)  Skin Turgor Non-tenting

## 2017-05-06 NOTE — Progress Notes (Signed)
[facility] Progress Note  Patient: Brittany Reid URK:[id number]   PCP: Drake Leach, MD DOB: 07/04/1941   DOA: 05/05/2017   DOS: 05/06/2017   Date of Service: the patient was seen and examined on 05/06/2017  Subjective: feeling better. Shortness of breath is better. Orthopnea is somewhat better.no chest pain abdominal pain nausea or vomiting  Brief hospital course: Pt. with PMH of hypertension, diabetes mellitus on oral hypoglycemics, chronic ITP; admitted on 05/05/2017, presented with complaint of shortness of breath, was found to have anasarca. Currently further plan is continue diuresis and further workup of anasarca.  Assessment and Plan: Acute CHF /anasarca -Patient clinically looks fluid overloaded with bilateral lower extremity pitting edema, abdominal swelling suggesting ascites -Admit on heart failure pathway, start IV Lasix 40 mg every 12 hours, strict ins and outs, daily weights -Continue home beta-blocker, hold ACE inhibitor for now until we know more why she is so fluid overloaded  Acute hypoxic respiratory failure -PE is on differential for hypoxia/shortness of breath as her CXR wasn't that obvious for pulmonary edema.  D dimer extremely elevated, negative CT angio for PE  Elevated liver enzymes -Patient has slightly elevation in her AST/ALT/bilirubin, her albumin is slightly low and she is thrombocytopenic.  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 Labs appear stable from care everywhere   Type 2 diabetes mellitus -Hold metformin for now, place patient on sliding scale  Hypertension -Resume home metoprolol  Diet: cardiac diet DVT Prophylaxis: subcutaneous Heparin Advance goals of care discussion: full code  Family Communication: family was present at bedside, at the time of interview. The pt provided permission to discuss medical plan with the family. Opportunity  was given to ask question and all questions were answered satisfactorily.   Disposition:  Discharge to home.  Consultants: noen Procedures: Echocardiogram   Antibiotics: Anti-infectives    None       Objective: Physical Exam: Vitals:   05/06/17 0637 05/06/17 0935 05/06/17 1204 05/06/17 1514  BP: (!) 144/68 137/60 (!) 130/52 127/61  Pulse: (!) 119 (!) 115 96 81  Resp: (!) 21 18 (!) 21 15  Temp: 97.6 F (36.4 C)   97.7 F (36.5 C)  TempSrc: Oral   Oral  SpO2: 94% 93% 96% 100%  Weight:    105 kg (231 lb 7.7 oz)  Height:    5\' 4"  (1.626 m)    Intake/Output Summary (Last 24 hours) at 05/06/17 1844 Last data filed at 05/06/17 1056  Gross per 24 hour  Intake                0 ml  Output             1200 ml  Net            -1200 ml   Filed Weights   05/05/17 1215 05/06/17 1514  Weight: 99.8 kg (220 lb) 105 kg (231 lb 7.7 oz)   General: Alert, Awake and Oriented to Time, Place and Person. Appear in moderate distress, affect appropriate Eyes: PERRL, Conjunctiva normal ENT: Oral Mucosa clear moist. Neck: positive JVD, no Abnormal Mass Or lumps Cardiovascular: S1 and S2 Present, no Murmur, Peripheral Pulses Present Respiratory: normal respiratory effort, Bilateral Air entry equal and Decreased, no use of accessory muscle, basal Crackles, no wheezes Abdomen: Bowel Sound present, Soft and no tenderness, no hernia Skin: no redness, no Rash, no induration Extremities: bilateral Pedal edema, no calf tenderness  Neurologic: Grossly no focal neuro deficit. Bilaterally Equal motor strength  Data Reviewed: CBC:  Recent Labs Lab 05/05/17 1256  WBC 6.6  NEUTROABS 3.8  HGB 15.5*  HCT 44.6  MCV 104.7*  PLT 91*   Basic Metabolic Panel:  Recent Labs Lab 05/05/17 1256  NA 134*  K 3.9  CL 96*  CO2 28  GLUCOSE 131*  BUN 12  CREATININE 0.70  CALCIUM 8.9    Liver Function Tests:  Recent Labs Lab 05/05/17 1256  AST 87*  ALT 59*  ALKPHOS 81  BILITOT 2.3*  PROT 7.2   ALBUMIN 2.9*   No results for input(s): LIPASE, AMYLASE in the last 168 hours. No results for input(s): AMMONIA in the last 168 hours. Coagulation Profile: No results for input(s): INR, PROTIME in the last 168 hours. Cardiac Enzymes:  Recent Labs Lab 05/05/17 1256  TROPONINI 0.04*   BNP (last 3 results) No results for input(s): PROBNP in the last 8760 hours. CBG:  Recent Labs Lab 05/06/17 0808 05/06/17 1134 05/06/17 1647  GLUCAP 91 122* 94   Studies: Ct Angio Chest Pe W Or Wo Contrast  Result Date: 05/05/2017 CLINICAL DATA:  Shortness of breath with exertion and BILATERAL leg swelling for 2 weeks ; history diabetes mellitus, hypertension, CHF EXAM: CT ANGIOGRAPHY CHEST WITH CONTRAST TECHNIQUE: Multidetector CT imaging of the chest was performed using the standard protocol during bolus administration of intravenous contrast. Multiplanar CT image reconstructions and MIPs were obtained to evaluate the vascular anatomy. CONTRAST:  100 cc Isovue 370 IV COMPARISON:  None FINDINGS: Cardiovascular: Suboptimal opacification of the pulmonary arteries likely due to a combination of slightly delayed imaging and less tight contrast bolus. However pulmonary arteries are adequately opacified for diagnosis. Pulmonary arteries appear grossly patent without evidence of pulmonary embolism. Aorta normal caliber with scattered atherosclerotic calcifications. No pericardial effusion. Cardiac chambers appear mildly enlarged. Coronary arterial calcifications are also present. Mediastinum/Nodes: Small hiatal hernia. Esophagus grossly unremarkable. Base of cervical region normal appearance. Scattered normal sized mediastinal lymph nodes without thoracic adenopathy. Lungs/Pleura: Small BILATERAL pleural effusions. Scattered interstitial prominence question representing mild pulmonary edema. Bibasilar atelectasis. Calcified granulomata RIGHT lung. No pneumothorax. Upper Abdomen: Small nodular cirrhotic appearing  liver with significant upper abdominal ascites. Cholelithiasis. Musculoskeletal: Degenerative disc disease changes thoracic spine. Review of the MIP images confirms the above findings. IMPRESSION: Small BILATERAL pleural effusions and bibasilar atelectasis and probable mild pulmonary edema. No gross evidence of pulmonary embolism. Small cirrhotic appearing liver with significant upper abdominal ascites. Small hiatal hernia. Coronary arterial calcifications. Aortic Atherosclerosis (ICD10-I70.0). Electronically Signed   By: Lavonia Dana M.D.   On: 05/05/2017 19:13    Scheduled Meds: . amitriptyline  50 mg Oral QHS  . aspirin EC  81 mg Oral Daily  . enoxaparin (LOVENOX) injection  40 mg Subcutaneous Q24H  . furosemide  40 mg Intravenous Q12H  . insulin aspart  0-9 Units Subcutaneous TID WC  . metoprolol tartrate  25 mg Oral BID  . simvastatin  20 mg Oral q1800  . sodium chloride flush  3 mL Intravenous Q12H   Continuous Infusions: . sodium chloride     PRN Meds: sodium chloride, acetaminophen, ondansetron (ZOFRAN) IV, sodium chloride flush  Time spent: 35 minutes  Author: Berle Mull, MD [facility] Pager: [phone] 05/06/2017 6:44 PM  If 7PM-7AM, please contact night-coverage at [email or web address], password [facility]

## 2017-05-07 LAB — CBC
HCT: 40.8 % (ref 36.0–46.0)
HEMOGLOBIN: 13.9 g/dL (ref 12.0–15.0)
MCH: 35.9 pg — ABNORMAL HIGH (ref 26.0–34.0)
MCHC: 34.1 g/dL (ref 30.0–36.0)
MCV: 105.4 fL — ABNORMAL HIGH (ref 78.0–100.0)
PLATELETS: 65 10*3/uL — AB (ref 150–400)
RBC: 3.87 MIL/uL (ref 3.87–5.11)
RDW: 17 % — ABNORMAL HIGH (ref 11.5–15.5)
WBC: 6.1 10*3/uL (ref 4.0–10.5)

## 2017-05-07 LAB — BASIC METABOLIC PANEL
Anion gap: 6 (ref 5–15)
BUN: 13 mg/dL (ref 6–20)
CHLORIDE: 99 mmol/L — AB (ref 101–111)
CO2: 30 mmol/L (ref 22–32)
Calcium: 8.3 mg/dL — ABNORMAL LOW (ref 8.9–10.3)
Creatinine, Ser: 0.5 mg/dL (ref 0.44–1.00)
GFR calc non Af Amer: >60 mL/min (ref >60)
Glucose, Bld: 105 mg/dL — ABNORMAL HIGH (ref 65–99)
POTASSIUM: 3.3 mmol/L — AB (ref 3.5–5.1)
SODIUM: 135 mmol/L (ref 135–145)

## 2017-05-07 LAB — T4, FREE: Free T4: 1.52 ng/dL — ABNORMAL HIGH (ref 0.61–1.12)

## 2017-05-07 LAB — MAGNESIUM: MAGNESIUM: 1.5 mg/dL — AB (ref 1.7–2.4)

## 2017-05-07 LAB — GLUCOSE, CAPILLARY
GLUCOSE-CAPILLARY: 106 mg/dL — AB (ref 65–99)
Glucose-Capillary: 129 mg/dL — ABNORMAL HIGH (ref 65–99)
Glucose-Capillary: 208 mg/dL — ABNORMAL HIGH (ref 65–99)
Glucose-Capillary: 151 mg/dL — ABNORMAL HIGH (ref 65–99)

## 2017-05-07 LAB — CORTISOL-AM, BLOOD: CORTISOL - AM: 6.8 ug/dL (ref 6.7–22.6)

## 2017-05-07 LAB — TSH: TSH: 1.994 u[IU]/mL (ref 0.350–4.500)

## 2017-05-07 LAB — LACTATE DEHYDROGENASE: LDH: 252 U/L — ABNORMAL HIGH (ref 98–192)

## 2017-05-07 MED ORDER — TRAMADOL HCL 50 MG PO TABS: 50.0000 mg | ORAL_TABLET | Freq: Once | ORAL | Status: DC

## 2017-05-07 MED ORDER — POTASSIUM CHLORIDE CRYS ER 20 MEQ PO TBCR
30.0000 meq | EXTENDED_RELEASE_TABLET | Freq: Two times a day (BID) | ORAL | Status: DC
  Administered 2017-05-07 – 2017-05-12 (×11): 30 meq via ORAL
  Filled 2017-05-07 (×11): qty 1

## 2017-05-07 MED ORDER — MAGNESIUM SULFATE 2 GM/50ML IV SOLN
2.0000 g | Freq: Once | INTRAVENOUS | Status: AC
  Administered 2017-05-07: 2 g via INTRAVENOUS
  Filled 2017-05-07: qty 50

## 2017-05-07 MED ORDER — COSYNTROPIN 0.25 MG IJ SOLR
0.2500 mg | Freq: Once | INTRAMUSCULAR | Status: AC
  Administered 2017-05-08: 0.25 mg via INTRAVENOUS
  Filled 2017-05-07: qty 0.25

## 2017-05-07 NOTE — Progress Notes (Signed)
[facility] Progress Note  Patient: Brittany Reid IDP:[id number]   PCP: Drake Leach, MD DOB: October 04, 1940   DOA: 05/05/2017   DOS: 05/07/2017   Date of Service: the patient was seen and examined on 05/07/2017  Subjective: Continues to feel better, has some dry mouth. No nausea no vomiting no chest pain or shortness of breath. Appetite is still limited.  Brief hospital course: Pt. with PMH of hypertension, diabetes mellitus on oral hypoglycemics, chronic ITP; admitted on 05/05/2017, presented with complaint of shortness of breath, was found to have anasarca. Currently further plan is continue diuresis and further workup of anasarca.  Assessment and Plan: Acute CHF /anasarca -Patient clinically looks fluid overloaded with bilateral lower extremity pitting edema, abdominal swelling suggesting ascites -Admit on heart failure pathway, continue IV Lasix 40 mg every 12 hours, strict ins and outs, daily weights -Continue home beta-blocker, hold ACE inhibitor for now Follow-up on echocardiogram.  Cirrhosis of the liver. Ascites Likely contributing to patient's anasarca. No significant hypoalbuminemia. 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 No history of alcohol abuse. New-onset ascites, will get ultrasound guided paracentesis for further workup.  Acute hypoxic respiratory failure -PE is on differential for hypoxia/shortness of breath as her CXR wasn't that obvious for pulmonary edema.  D dimer extremely elevated, negative CT angio for PE  Elevated liver enzymes -Patient has slightly elevation in her AST/ALT/bilirubin, her albumin is slightly low and she is thrombocytopenic. Continue to monitor and avoid hepatotoxic medications.  Chronic ITP -Followed by hematology as an outpatient. Labs appear stable from care everywhere   Type 2 diabetes mellitus -Hold metformin for now, place patient on sliding scale  Hypertension -Resume home metoprolol  Low cortisol. AM cortisol this morning is  6.8. I will check cosyntropin stimulation test.  Hypokalemia, hypomagnesemia. Replacing, will recheck tomorrow.  Mild elevation of serum free T4. Recommended recheck in 1 month once acute issues are resolved.  Diet: cardiac diet DVT Prophylaxis: scd Advance goals of care discussion: full code  Family Communication: no family was present at bedside, at the time of interview.   Disposition:  Discharge to home.  Consultants: none Procedures: Echocardiogram   Antibiotics: Anti-infectives    None       Objective: Physical Exam: Vitals:   05/07/17 0112 05/07/17 0157 05/07/17 0518 05/07/17 1332  BP: 115/79 (!) 110/55 (!) 110/52 (!) 149/69  Pulse: 81 88 87 (!) 115  Resp: 19 18 18 17   Temp: 97.9 F (36.6 C) 98.7 F (37.1 C) 97.8 F (36.6 C) 98.1 F (36.7 C)  TempSrc: Oral Oral Oral   SpO2: 93% 94% 95% 92%  Weight:   104.6 kg (230 lb 9.6 oz)   Height:        Intake/Output Summary (Last 24 hours) at 05/07/17 1442 Last data filed at 05/07/17 1209  Gross per 24 hour  Intake              540 ml  Output             1600 ml  Net            -1060 ml   Filed Weights   05/05/17 1215 05/06/17 1514 05/07/17 0518  Weight: 99.8 kg (220 lb) 105 kg (231 lb 7.7 oz) 104.6 kg (230 lb 9.6 oz)   General: Alert, Awake and Oriented to Time, Place and Person. Appear in moderate distress, affect appropriate Eyes: PERRL, Conjunctiva normal ENT: Oral Mucosa clear moist. Neck: positive JVD, no Abnormal Mass Or lumps Cardiovascular: S1 and  S2 Present, no Murmur, Peripheral Pulses Present Respiratory: normal respiratory effort, Bilateral Air entry equal and Decreased, no use of accessory muscle, basal Crackles, no wheezes Abdomen: Bowel Sound present, Soft and no tenderness, no hernia Skin: no redness, no Rash, no induration Extremities: bilateral Pedal edema, no calf tenderness Neurologic: Grossly no focal neuro deficit. Bilaterally Equal motor strength  Data Reviewed: CBC:  Recent  Labs Lab 05/05/17 1256 05/07/17 0450  WBC 6.6 6.1  NEUTROABS 3.8  --   HGB 15.5* 13.9  HCT 44.6 40.8  MCV 104.7* 105.4*  PLT 91* 65*   Basic Metabolic Panel:  Recent Labs Lab 05/05/17 1256 05/07/17 0450  NA 134* 135  K 3.9 3.3*  CL 96* 99*  CO2 28 30  GLUCOSE 131* 105*  BUN 12 13  CREATININE 0.70 0.50  CALCIUM 8.9 8.3*  MG  --  1.5*    Liver Function Tests:  Recent Labs Lab 05/05/17 1256  AST 87*  ALT 59*  ALKPHOS 81  BILITOT 2.3*  PROT 7.2  ALBUMIN 2.9*   No results for input(s): LIPASE, AMYLASE in the last 168 hours. No results for input(s): AMMONIA in the last 168 hours. Coagulation Profile: No results for input(s): INR, PROTIME in the last 168 hours. Cardiac Enzymes:  Recent Labs Lab 05/05/17 1256  TROPONINI 0.04*   BNP (last 3 results) No results for input(s): PROBNP in the last 8760 hours. CBG:  Recent Labs Lab 05/06/17 1134 05/06/17 1647 05/06/17 2249 05/07/17 0801 05/07/17 1157  GLUCAP 122* 94 123* 106* 129*   Studies: US Pelvic Doppler (torsion R/o Or Mass Arterial Flow)  Result Date: 05/06/2017 CLINICAL DATA:  Anasarca, cirrhosis. EXAM: DUPLEX ULTRASOUND OF LIVER TECHNIQUE: Color and duplex Doppler ultrasound was performed to evaluate the hepatic in-flow and out-flow vessels. COMPARISON:  None. FINDINGS: Portal Vein Velocities, all hepatopetal Main: proximal portal vein 23.1 cm/second, mid 24.7 cm/second and distal 14.3 cm/seconds Right:  18.8 cm/sec Left:  19.9 cm/sec Hepatic Vein Velocities, all hepatofugal Right:  17.8 cm/sec Middle:  27.4 cm/sec Left:  7.6 cm/sec Intrahepatic IVC: Patent with velocity of 14.1 cm/second Hepatic Artery Velocity:  38.6 cm/sec Splenic Vein Velocity:  7.7 cm/sec Varices: Absent Ascites: Moderate to large volume of ascites with cirrhosis of the liver. IMPRESSION: Hepatopetal flow without thrombosis of the portal vein. Moderate to large amount of ascites noted with cirrhotic appearing liver. Electronically  Signed   By: Ashley Royalty M.D.   On: 05/06/2017 19:26   US Abdomen Limited Ruq  Result Date: 05/06/2017 CLINICAL DATA:  Anasarca EXAM: ULTRASOUND ABDOMEN LIMITED RIGHT UPPER QUADRANT COMPARISON:  None. FINDINGS: Gallbladder: There is a nonshadowing echogenic focus in the gallbladder neck measuring 7.5 mm possibly representing a small polyp or adherent stone. No secondary signs of acute cholecystitis. The gallbladder wall is not thickened at 2.7 mm. Common bile duct: Diameter: Normal at 2.9 mm Liver: Cirrhotic appearing liver with surface nodularity. No discrete mass Portal vein is patent on color Doppler imaging with normal direction of blood flow towards the liver. Other: Large amount of ascites is seen in the included upper abdomen. IMPRESSION: 1. Cirrhotic appearing liver with ascites. Hepatopetal flow within the main portal vein. 2. 7.5 mm polyp or adherent gallstone noted in the neck of the gallbladder without secondary signs of cholecystitis. Electronically Signed   By: Ashley Royalty M.D.   On: 05/06/2017 19:20    Scheduled Meds: . amitriptyline  50 mg Oral QHS  . aspirin EC  81 mg Oral Daily  . [  START ON 05/08/2017] cosyntropin  0.25 mg Intravenous Once  . enoxaparin (LOVENOX) injection  40 mg Subcutaneous Q24H  . furosemide  40 mg Intravenous Q12H  . insulin aspart  0-9 Units Subcutaneous TID WC  . metoprolol tartrate  25 mg Oral BID  . potassium chloride  30 mEq Oral BID  . simvastatin  20 mg Oral q1800  . sodium chloride flush  3 mL Intravenous Q12H   Continuous Infusions: . sodium chloride     PRN Meds: sodium chloride, acetaminophen, ondansetron (ZOFRAN) IV, sodium chloride flush  Time spent: 35 minutes  Author: Berle Mull, MD [facility] Pager: [phone] 05/07/2017 2:42 PM  If 7PM-7AM, please contact night-coverage at [email or web address], password [facility]

## 2017-05-07 NOTE — Progress Notes (Signed)
NT assisting patient from bed to the bathroom via walker, patient lost her balance and fell on her buttocks and L. Hip. Alert and verbally responsive, Denies any pain or discomfort upon assessment. ROM WNL except limited movement to R. Leg present on admission. No apparent injury noted.  Assisted back to bed x 3 person assist. Patient refuses contact with family regarding fall. Resting quietly in bed at this time in no acute episode, call bell within reach and bed at low position.

## 2017-05-07 NOTE — Progress Notes (Signed)
PT Cancellation Note  Patient Details Name: Brittany Reid MRN: [id number] DOB: [birth year]   Cancelled Treatment:    Reason Eval/Treat Not Completed: Patient declined, no reason specified, attempted to encourage pt but she stated her legs felt "too heavy"   [facility] 05/07/2017, 3:29 PM

## 2017-05-08 ENCOUNTER — Inpatient Hospital Stay (HOSPITAL_COMMUNITY): Payer: PPO

## 2017-05-08 LAB — BASIC METABOLIC PANEL
ANION GAP: 7 (ref 5–15)
BUN: 13 mg/dL (ref 6–20)
CALCIUM: 8.5 mg/dL — AB (ref 8.9–10.3)
CO2: 32 mmol/L (ref 22–32)
Chloride: 99 mmol/L — ABNORMAL LOW (ref 101–111)
Creatinine, Ser: 0.53 mg/dL (ref 0.44–1.00)
GFR calc Af Amer: >60 mL/min (ref >60)
GFR calc non Af Amer: >60 mL/min (ref >60)
GLUCOSE: 118 mg/dL — AB (ref 65–99)
POTASSIUM: 3.9 mmol/L (ref 3.5–5.1)
Sodium: 138 mmol/L (ref 135–145)

## 2017-05-08 LAB — PROTEIN, PLEURAL OR PERITONEAL FLUID: Total protein, fluid: <3 g/dL

## 2017-05-08 LAB — CBC
HEMATOCRIT: 41.4 % (ref 36.0–46.0)
HEMOGLOBIN: 14 g/dL (ref 12.0–15.0)
MCH: 35.9 pg — ABNORMAL HIGH (ref 26.0–34.0)
MCHC: 33.8 g/dL (ref 30.0–36.0)
MCV: 106.2 fL — ABNORMAL HIGH (ref 78.0–100.0)
Platelets: 74 10*3/uL — ABNORMAL LOW (ref 150–400)
RBC: 3.9 MIL/uL (ref 3.87–5.11)
RDW: 17 % — ABNORMAL HIGH (ref 11.5–15.5)
WBC: 6.7 10*3/uL (ref 4.0–10.5)

## 2017-05-08 LAB — BODY FLUID CELL COUNT WITH DIFFERENTIAL
Eos, Fluid: 0 %
LYMPHS FL: 46 %
Monocyte-Macrophage-Serous Fluid: 44 % — ABNORMAL LOW (ref 50–90)
Neutrophil Count, Fluid: 10 % (ref 0–25)
Total Nucleated Cell Count, Fluid: 135 cu mm (ref 0–1000)

## 2017-05-08 LAB — GLUCOSE, CAPILLARY
GLUCOSE-CAPILLARY: 123 mg/dL — AB (ref 65–99)
GLUCOSE-CAPILLARY: 143 mg/dL — AB (ref 65–99)
GLUCOSE-CAPILLARY: 137 mg/dL — AB (ref 65–99)
Glucose-Capillary: 123 mg/dL — ABNORMAL HIGH (ref 65–99)

## 2017-05-08 LAB — GLUCOSE, PLEURAL OR PERITONEAL FLUID: Glucose, Fluid: 126 mg/dL

## 2017-05-08 LAB — GRAM STAIN

## 2017-05-08 LAB — ECHOCARDIOGRAM COMPLETE
Height: 64 in
WEIGHTICAEL: 3520 [oz_av]

## 2017-05-08 LAB — ALBUMIN, PLEURAL OR PERITONEAL FLUID

## 2017-05-08 LAB — ACTH STIMULATION, 3 TIME POINTS
Cortisol, 30 Min: 19.7 ug/dL
Cortisol, 60 Min: 23.9 ug/dL
Cortisol, Base: 7.1 ug/dL

## 2017-05-08 LAB — LACTATE DEHYDROGENASE, PLEURAL OR PERITONEAL FLUID: LD, Fluid: 30 U/L — ABNORMAL HIGH (ref 3–23)

## 2017-05-08 NOTE — Progress Notes (Addendum)
[facility] Progress Note  Patient: Brittany Reid JJK:[id number]   PCP: Drake Leach, MD DOB: [birth year]   DOA: 05/05/2017   DOS: 05/08/2017   Date of Service: the patient was seen and examined on 05/08/2017  Subjective: Feeling better. Appetite improving. No nausea no vomiting. No shortness of breath. No chest pain or abdominal pain. Had a mechanical fall yesterday no injury reported by patient.  Brief hospital course: Pt. with PMH of hypertension, diabetes mellitus on oral hypoglycemics, chronic ITP; admitted on 05/05/2017, presented with complaint of shortness of breath, was found to have anasarca. Currently further plan is continue diuresis and further workup of anasarca.  Assessment and Plan: Acute CHF /anasarca -Patient clinically looks fluid overloaded with bilateral lower extremity pitting edema, abdominal swelling suggesting ascites -Admit on heart failure pathway, continue IV Lasix 40 mg every 12 hours, strict ins and outs, daily weights -Continue home beta-blocker, hold ACE inhibitor for now Echocardiogram shows preserved EF, diastolic dysfunction with hyperdynamic circulation. Patient is already on Lopressor and continue that.  Cirrhosis of the liver. Ascites Likely contributing to patient's anasarca. No significant hypoalbuminemia. 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 No history of alcohol abuse. New-onset ascites,  Ultrasound paracentesis suggest portal hypertension causing ascites likely from cirrhosis of the liver. No evidence of SBP. Cytology currently pending. she'll need outpatient follow-up with GI for screening EGD.  Acute hypoxic respiratory failure -PE is on differential for hypoxia/shortness of breath as her CXR wasn't that obvious for pulmonary edema.  D dimer extremely elevated, negative CT angio for PE  Elevated liver enzymes -Patient has slightly elevation in her AST/ALT/bilirubin, her albumin is slightly low and she is thrombocytopenic. Continue to monitor  and avoid hepatotoxic medications.  Chronic ITP -Followed by hematology as an outpatient. Labs appear stable from care everywhere   Type 2 diabetes mellitus -Hold metformin for now, place patient on sliding scale  Hypertension -Resume home metoprolol  Low cortisol. AM cortisol 6.8. normal cosyntropin stimulation test.  Hypokalemia, hypomagnesemia. Replacing, will recheck tomorrow.  Mild elevation of serum free T4. Recommended recheck in 1 month once acute issues are resolved.  Diet: cardiac diet DVT Prophylaxis: scd Advance goals of care discussion: full code  Family Communication: no family was present at bedside, at the time of interview.   Disposition:  Discharge to home.  Consultants: none Procedures: Echocardiogram   Antibiotics: Anti-infectives    None       Objective: Physical Exam: Vitals:   05/08/17 0429 05/08/17 0925 05/08/17 0935 05/08/17 0944  BP: 129/62 (!) 144/67 (!) 126/57 (!) 129/57  Pulse: 87     Resp: 18     Temp: 97.9 F (36.6 C)     TempSrc: Oral     SpO2: 94%     Weight: 103.5 kg (228 lb 2.8 oz)     Height:        Intake/Output Summary (Last 24 hours) at 05/08/17 1103 Last data filed at 05/08/17 0436  Gross per 24 hour  Intake              240 ml  Output              950 ml  Net             -710 ml   Filed Weights   05/06/17 1514 05/07/17 0518 05/08/17 0429  Weight: 105 kg (231 lb 7.7 oz) 104.6 kg (230 lb 9.6 oz) 103.5 kg (228 lb 2.8 oz)   General: Alert, Awake and Oriented to  Time, Place and Person. Appear in moderate distress, affect appropriate Eyes: PERRL, Conjunctiva normal ENT: Oral Mucosa clear moist. Neck: positive JVD, no Abnormal Mass Or lumps Cardiovascular: S1 and S2 Present, no Murmur, Peripheral Pulses Present Respiratory: normal respiratory effort, Bilateral Air entry equal and Decreased, no use of accessory muscle, basal Crackles, no wheezes Abdomen: Bowel Sound present, Soft and no tenderness, no  hernia Skin: no redness, no Rash, no induration Extremities: bilateral Pedal edema, no calf tenderness Neurologic: Grossly no focal neuro deficit. Bilaterally Equal motor strength  Data Reviewed: CBC:  Recent Labs Lab 05/05/17 1256 05/07/17 0450 05/08/17 0627  WBC 6.6 6.1 6.7  NEUTROABS 3.8  --   --   HGB 15.5* 13.9 14.0  HCT 44.6 40.8 41.4  MCV 104.7* 105.4* 106.2*  PLT 91* 65* 74*   Basic Metabolic Panel:  Recent Labs Lab 05/05/17 1256 05/07/17 0450 05/08/17 0627  NA 134* 135 138  K 3.9 3.3* 3.9  CL 96* 99* 99*  CO2 28 30 32  GLUCOSE 131* 105* 118*  BUN 12 13 13   CREATININE 0.70 0.50 0.53  CALCIUM 8.9 8.3* 8.5*  MG  --  1.5*  --     Liver Function Tests:  Recent Labs Lab 05/05/17 1256  AST 87*  ALT 59*  ALKPHOS 81  BILITOT 2.3*  PROT 7.2  ALBUMIN 2.9*   No results for input(s): LIPASE, AMYLASE in the last 168 hours. No results for input(s): AMMONIA in the last 168 hours. Coagulation Profile: No results for input(s): INR, PROTIME in the last 168 hours. Cardiac Enzymes:  Recent Labs Lab 05/05/17 1256  TROPONINI 0.04*   BNP (last 3 results) No results for input(s): PROBNP in the last 8760 hours. CBG:  Recent Labs Lab 05/07/17 0801 05/07/17 1157 05/07/17 1652 05/07/17 2056 05/08/17 0730  GLUCAP 106* 129* 208* 151* 123*   Studies: No results found.  Scheduled Meds: . amitriptyline  50 mg Oral QHS  . aspirin EC  81 mg Oral Daily  . enoxaparin (LOVENOX) injection  40 mg Subcutaneous Q24H  . furosemide  40 mg Intravenous Q12H  . insulin aspart  0-9 Units Subcutaneous TID WC  . metoprolol tartrate  25 mg Oral BID  . potassium chloride  30 mEq Oral BID  . simvastatin  20 mg Oral q1800  . sodium chloride flush  3 mL Intravenous Q12H   Continuous Infusions: . sodium chloride     PRN Meds: sodium chloride, acetaminophen, ondansetron (ZOFRAN) IV, sodium chloride flush  Time spent: 35 minutes  Author: Berle Mull, MD [facility] Pager: [phone] 05/08/2017 11:03 AM  If 7PM-7AM, please contact night-coverage at [email or web address], password [facility]

## 2017-05-08 NOTE — Procedures (Signed)
PROCEDURE SUMMARY:  Successful US guided paracentesis from right lateral abodmen.  Yielded 1.0 L of hazy, yellow fluid.  No immediate complications.  Pt tolerated well.   Specimen was sent for labs.  Docia Barrier PA-C 05/08/2017 12:02 PM

## 2017-05-08 NOTE — Progress Notes (Signed)
PT Cancellation Note  Patient Details Name: Brittany Reid MRN: [id number] DOB: Jan 21, 1941   Cancelled Treatment:     PT order received but eval deferred.  Pt for paracentesis.  Will follow.   [name] 05/08/2017, 1:30 PM

## 2017-05-09 LAB — COMPREHENSIVE METABOLIC PANEL
ALK PHOS: 63 U/L (ref 38–126)
ALT: 49 U/L (ref 14–54)
ANION GAP: 6 (ref 5–15)
AST: 79 U/L — ABNORMAL HIGH (ref 15–41)
Albumin: 2.5 g/dL — ABNORMAL LOW (ref 3.5–5.0)
BILIRUBIN TOTAL: 1.7 mg/dL — AB (ref 0.3–1.2)
BUN: 12 mg/dL (ref 6–20)
CALCIUM: 8.6 mg/dL — AB (ref 8.9–10.3)
CO2: 34 mmol/L — ABNORMAL HIGH (ref 22–32)
Chloride: 98 mmol/L — ABNORMAL LOW (ref 101–111)
Creatinine, Ser: 0.48 mg/dL (ref 0.44–1.00)
GFR calc non Af Amer: >60 mL/min (ref >60)
Glucose, Bld: 98 mg/dL (ref 65–99)
Potassium: 3.9 mmol/L (ref 3.5–5.1)
Sodium: 138 mmol/L (ref 135–145)
TOTAL PROTEIN: 6.2 g/dL — AB (ref 6.5–8.1)

## 2017-05-09 LAB — PROTIME-INR
INR: 1.35
Prothrombin Time: 16.6 seconds — ABNORMAL HIGH (ref 11.4–15.2)

## 2017-05-09 LAB — GLUCOSE, CAPILLARY
GLUCOSE-CAPILLARY: 150 mg/dL — AB (ref 65–99)
GLUCOSE-CAPILLARY: 111 mg/dL — AB (ref 65–99)
Glucose-Capillary: 101 mg/dL — ABNORMAL HIGH (ref 65–99)
Glucose-Capillary: 101 mg/dL — ABNORMAL HIGH (ref 65–99)

## 2017-05-09 LAB — CBC
HCT: 42.2 % (ref 36.0–46.0)
Hemoglobin: 14.2 g/dL (ref 12.0–15.0)
MCH: 36 pg — ABNORMAL HIGH (ref 26.0–34.0)
MCHC: 33.6 g/dL (ref 30.0–36.0)
MCV: 107.1 fL — ABNORMAL HIGH (ref 78.0–100.0)
PLATELETS: 76 10*3/uL — AB (ref 150–400)
RBC: 3.94 MIL/uL (ref 3.87–5.11)
RDW: 16.8 % — AB (ref 11.5–15.5)
WBC: 6.7 10*3/uL (ref 4.0–10.5)

## 2017-05-09 LAB — TRIGLYCERIDES, BODY FLUIDS: TRIGLYCERIDES FL: 38 mg/dL

## 2017-05-09 MED ORDER — SPIRONOLACTONE 25 MG PO TABS
100.0000 mg | ORAL_TABLET | Freq: Every day | ORAL | Status: DC
  Administered 2017-05-09 – 2017-05-12 (×4): 100 mg via ORAL
  Filled 2017-05-09 (×4): qty 4

## 2017-05-09 MED ORDER — ALBUMIN HUMAN 25 % IV SOLN
25.0000 g | Freq: Once | INTRAVENOUS | Status: AC
  Administered 2017-05-09: 25 g via INTRAVENOUS
  Filled 2017-05-09: qty 100

## 2017-05-09 MED ORDER — FUROSEMIDE 10 MG/ML IJ SOLN
60.0000 mg | Freq: Once | INTRAMUSCULAR | Status: AC
  Administered 2017-05-09: 60 mg via INTRAVENOUS
  Filled 2017-05-09: qty 6

## 2017-05-09 NOTE — Progress Notes (Signed)
[facility] Progress Note  Patient: Brittany Reid VZD:[id number]   PCP: Drake Leach, MD DOB: [birth year]   DOA: 05/05/2017   DOS: 05/09/2017   Date of Service: the patient was seen and examined on 05/09/2017  Subjective: Feeling better. Appetite improving.   Brief hospital course: Pt. with PMH of hypertension, diabetes mellitus on oral hypoglycemics, chronic ITP; admitted on 05/05/2017, presented with complaint of shortness of breath, was found to have anasarca. Currently further plan is continue diuresis and further workup of anasarca.  Assessment and Plan: Acute CHF /anasarca -Patient clinically looks fluid overloaded with bilateral lower extremity pitting edema, abdominal swelling suggesting ascites -Admit on heart failure pathway, continue IV Lasix 40 mg every 12 hours, strict ins and outs, daily weights -Continue home beta-blocker, hold ACE inhibitor for now Echocardiogram shows preserved EF, diastolic dysfunction with hyperdynamic circulation. Patient is already on Lopressor and continue that.  Cirrhosis of the liver. Ascites Likely contributing to patient's anasarca. No significant hypoalbuminemia. 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 No history of alcohol abuse. New-onset ascites,  Ultrasound paracentesis suggest portal hypertension causing ascites likely from cirrhosis of the liver. No evidence of SBP. Cytology currently pending. she'll need outpatient follow-up with GI for screening EGD. Add aldactone and continue lasix.   Acute hypoxic respiratory failure -PE is on differential for hypoxia/shortness of breath as her CXR wasn't that obvious for pulmonary edema.  D dimer extremely elevated, negative CT angio for PE  Elevated liver enzymes -Patient has slightly elevation in her AST/ALT/bilirubin, her albumin is slightly low and she is thrombocytopenic. Continue to monitor and avoid hepatotoxic medications.  Chronic ITP -Followed by hematology as an outpatient. Labs appear  stable from care everywhere   Type 2 diabetes mellitus -Hold metformin for now, place patient on sliding scale  Hypertension -Resume home metoprolol  Low cortisol. AM cortisol 6.8. normal cosyntropin stimulation test.  Hypokalemia, hypomagnesemia. Replacing, will recheck tomorrow. Now on aldactone   Mild elevation of serum free T4. Recommended recheck in 1 month once acute issues are resolved.  Diet: cardiac diet DVT Prophylaxis: scd Advance goals of care discussion: full code  Family Communication: no family was present at bedside, at the time of interview.   Disposition:  Discharge to home.  Consultants: none Procedures: Echocardiogram   Antibiotics: Anti-infectives    None       Objective: Physical Exam: Vitals:   05/08/17 2041 05/09/17 0452 05/09/17 0458 05/09/17 1123  BP: 129/60 130/69  115/63  Pulse: 100 81  86  Resp: 18 17    Temp: 98.5 F (36.9 C) 98 F (36.7 C)    TempSrc: Oral Oral    SpO2: 98% 94%    Weight:   102.7 kg (226 lb 6.6 oz)   Height:        Intake/Output Summary (Last 24 hours) at 05/09/17 1539 Last data filed at 05/09/17 1200  Gross per 24 hour  Intake              410 ml  Output             1650 ml  Net            -1240 ml   Filed Weights   05/07/17 0518 05/08/17 0429 05/09/17 0458  Weight: 104.6 kg (230 lb 9.6 oz) 103.5 kg (228 lb 2.8 oz) 102.7 kg (226 lb 6.6 oz)   General: Alert, Awake and Oriented to Time, Place and Person. Appear in moderate distress, affect appropriate Eyes: PERRL, Conjunctiva normal ENT:  Oral Mucosa clear moist. Neck: positive JVD, no Abnormal Mass Or lumps Cardiovascular: S1 and S2 Present, no Murmur, Peripheral Pulses Present Respiratory: normal respiratory effort, Bilateral Air entry equal and Decreased, no use of accessory muscle, basal Crackles, no wheezes Abdomen: Bowel Sound present, Soft and no tenderness, no hernia Skin: no redness, no Rash, no induration Extremities: bilateral Pedal  edema, no calf tenderness Neurologic: Grossly no focal neuro deficit. Bilaterally Equal motor strength  Data Reviewed: CBC:  Recent Labs Lab 05/05/17 1256 05/07/17 0450 05/08/17 0627 05/09/17 0459  WBC 6.6 6.1 6.7 6.7  NEUTROABS 3.8  --   --   --   HGB 15.5* 13.9 14.0 14.2  HCT 44.6 40.8 41.4 42.2  MCV 104.7* 105.4* 106.2* 107.1*  PLT 91* 65* 74* 76*   Basic Metabolic Panel:  Recent Labs Lab 05/05/17 1256 05/07/17 0450 05/08/17 0627 05/09/17 0459  NA 134* 135 138 138  K 3.9 3.3* 3.9 3.9  CL 96* 99* 99* 98*  CO2 28 30 32 34*  GLUCOSE 131* 105* 118* 98  BUN 12 13 13 12   CREATININE 0.70 0.50 0.53 0.48  CALCIUM 8.9 8.3* 8.5* 8.6*  MG  --  1.5*  --   --     Liver Function Tests:  Recent Labs Lab 05/05/17 1256 05/09/17 0459  AST 87* 79*  ALT 59* 49  ALKPHOS 81 63  BILITOT 2.3* 1.7*  PROT 7.2 6.2*  ALBUMIN 2.9* 2.5*   No results for input(s): LIPASE, AMYLASE in the last 168 hours. No results for input(s): AMMONIA in the last 168 hours. Coagulation Profile:  Recent Labs Lab 05/09/17 0459  INR 1.35   Cardiac Enzymes:  Recent Labs Lab 05/05/17 1256  TROPONINI 0.04*   BNP (last 3 results) No results for input(s): PROBNP in the last 8760 hours. CBG:  Recent Labs Lab 05/08/17 1217 05/08/17 1800 05/08/17 2034 05/09/17 0827 05/09/17 1224  GLUCAP 143* 137* 123* 101* 150*   Studies: No results found.  Scheduled Meds: . amitriptyline  50 mg Oral QHS  . aspirin EC  81 mg Oral Daily  . enoxaparin (LOVENOX) injection  40 mg Subcutaneous Q24H  . insulin aspart  0-9 Units Subcutaneous TID WC  . metoprolol tartrate  25 mg Oral BID  . potassium chloride  30 mEq Oral BID  . simvastatin  20 mg Oral q1800  . sodium chloride flush  3 mL Intravenous Q12H   Continuous Infusions: . sodium chloride     PRN Meds: sodium chloride, acetaminophen, ondansetron (ZOFRAN) IV, sodium chloride flush  Time spent: 35 minutes  Author: Berle Mull, MD [facility] Pager: [phone] 05/09/2017 3:39 PM  If 7PM-7AM, please contact night-coverage at [email or web address], password [facility]

## 2017-05-09 NOTE — Evaluation (Signed)
Physical Therapy Evaluation Patient Details Name: Brittany Reid MRN: [id number] DOB: 10-20-1940 Today's Date: 05/09/2017   History of Present Illness  : Brittany Reid Polhemus is a 76 y.o. female with medical history significant of hypertension, diabetes mellitus on oral hypoglycemics, chronic ITP, who presents to the emergency room with chief complaint of leg swelling and progressive  shortness of breath  Clinical Impression  Pt admitted with above diagnosis. Pt currently with functional limitations due to the deficits listed below (see PT Problem List). * Pt will benefit from skilled PT to increase their independence and safety with mobility to allow discharge to the venue listed below.   Pt desats on RA to 83%, returned >90% on 2L O2; may need SNF given number of recent falls; will follow in acute setting     Follow Up Recommendations SNF (vs HHPT with 24hr assist)    Equipment Recommendations  None recommended by PT    Recommendations for Other Services       Precautions / Restrictions Precautions Precautions: Fall      Mobility  Bed Mobility Overal bed mobility: Needs Assistance Bed Mobility: Supine to Sit     Supine to sit: HOB elevated;Supervision     General bed mobility comments: for safety  Transfers Overall transfer level: Needs assistance Equipment used: Rolling walker (2 wheeled) Transfers: Sit to/from [facility] Sit to Stand: Min assist Stand pivot transfers: Min assist       General transfer comment: transferred bed to BSC, BSC to bed and bed to chair; verbal cues for safety and hand placement  Ambulation/Gait Ambulation/Gait assistance: Min assist   Assistive device: Rolling walker (2 wheeled)       General Gait Details: 3 pivotal steps and  2 steps back to bed; pt with LOB while attempting to walk to chair (questionably self inflicted LOB--pt stated "why is this happening" while she was taking small steps and staggering); sat pt on bed  and brought chair close for stand pivot; further amb not attempted with +1 assist and pt frequency of falls  Stairs            Wheelchair Mobility    Modified Rankin (Stroke Patients Only)       Balance Overall balance assessment:  (pt reports 3 recent falls at home priro to adm) Sitting-balance support: Feet supported;No upper extremity supported;Single extremity supported Sitting balance-Leahy Scale: Good Sitting balance - Comments: pt is able to scoot forward and back in sitting, wt shift without LOB     Standing balance-Leahy Scale: Poor Standing balance comment: reliant on UEs; maintains static stand without incr postural sway                             Pertinent Vitals/Pain Pain Assessment: No/denies pain    Home Living Family/patient expects to be discharged to:: Private residence Living Arrangements: Spouse/significant other Available Help at Discharge: Family Type of Home: House Home Access: Stairs to enter   Technical brewer of Steps: 1 Home Layout: One level Home Equipment: Toilet riser;Walker - 2 wheels      Prior Function Level of Independence: Independent               Hand Dominance        Extremity/Trunk Assessment   Upper Extremity Assessment Upper Extremity Assessment: Defer to OT evaluation    Lower Extremity Assessment Lower Extremity Assessment: Generalized weakness       Communication  Communication: No difficulties  Cognition Arousal/Alertness: Awake/alert Behavior During Therapy: WFL for tasks assessed/performed Overall Cognitive Status: Within Functional Limits for tasks assessed                                        General Comments      Exercises     Assessment/Plan    PT Assessment Patient needs continued PT services  PT Problem List Decreased activity tolerance;Decreased balance;Decreased knowledge of use of DME;Decreased mobility       PT Treatment Interventions  DME instruction;Gait training;Functional mobility training;Therapeutic exercise;Therapeutic activities;Patient/family education    PT Goals (Current goals can be found in the Care Plan section)  Acute Rehab PT Goals Patient Stated Goal: to go home PT Goal Formulation: With patient Time For Goal Achievement: 05/23/17 Potential to Achieve Goals: Good    Frequency Min 3X/week   Barriers to discharge        Co-evaluation               AM-PAC PT "6 Clicks" Daily Activity  Outcome Measure Difficulty turning over in bed (including adjusting bedclothes, sheets and blankets)?: Unable Difficulty moving from lying on back to sitting on the side of the bed? : Unable Difficulty sitting down on and standing up from a chair with arms (e.g., wheelchair, bedside commode, etc,.)?: Unable Help needed moving to and from a bed to chair (including a wheelchair)?: A Little   Help needed climbing 3-5 steps with a railing? : A Lot 6 Click Score: 8    End of Session Equipment Utilized During Treatment: Gait belt;Oxygen Activity Tolerance: Patient tolerated treatment well Patient left: in chair;with call bell/phone within reach;with chair alarm set   PT Visit Diagnosis: Unsteadiness on feet (R26.81)    Time: 1937-9024 PT Time Calculation (min) (ACUTE ONLY): 26 min   Charges:   PT Evaluation $PT Eval Moderate Complexity: 1 Mod PT Treatments $Therapeutic Activity: 8-22 mins   PT G CodesKenyon Ana, PT Pager: [phone] 05/09/2017   [facility] 05/09/2017, 4:23 PM

## 2017-05-10 LAB — CBC
HEMATOCRIT: 40.1 % (ref 36.0–46.0)
HEMOGLOBIN: 13.2 g/dL (ref 12.0–15.0)
MCH: 34.8 pg — ABNORMAL HIGH (ref 26.0–34.0)
MCHC: 32.9 g/dL (ref 30.0–36.0)
MCV: 105.8 fL — ABNORMAL HIGH (ref 78.0–100.0)
Platelets: 61 10*3/uL — ABNORMAL LOW (ref 150–400)
RBC: 3.79 MIL/uL — AB (ref 3.87–5.11)
RDW: 16.5 % — AB (ref 11.5–15.5)
WBC: 5.7 10*3/uL (ref 4.0–10.5)

## 2017-05-10 LAB — COMPREHENSIVE METABOLIC PANEL
ALBUMIN: 2.5 g/dL — AB (ref 3.5–5.0)
ALK PHOS: 59 U/L (ref 38–126)
ALT: 44 U/L (ref 14–54)
AST: 72 U/L — AB (ref 15–41)
Anion gap: 4 — ABNORMAL LOW (ref 5–15)
BILIRUBIN TOTAL: 1.6 mg/dL — AB (ref 0.3–1.2)
BUN: 12 mg/dL (ref 6–20)
CO2: 34 mmol/L — ABNORMAL HIGH (ref 22–32)
CREATININE: 0.5 mg/dL (ref 0.44–1.00)
Calcium: 8.6 mg/dL — ABNORMAL LOW (ref 8.9–10.3)
Chloride: 100 mmol/L — ABNORMAL LOW (ref 101–111)
GFR calc Af Amer: >60 mL/min (ref >60)
GFR calc non Af Amer: >60 mL/min (ref >60)
GLUCOSE: 95 mg/dL (ref 65–99)
POTASSIUM: 4.5 mmol/L (ref 3.5–5.1)
Sodium: 138 mmol/L (ref 135–145)
TOTAL PROTEIN: 5.8 g/dL — AB (ref 6.5–8.1)

## 2017-05-10 LAB — ANTINUCLEAR ANTIBODIES, IFA: ANTINUCLEAR ANTIBODIES, IFA: NEGATIVE

## 2017-05-10 LAB — GLUCOSE, CAPILLARY
GLUCOSE-CAPILLARY: 111 mg/dL — AB (ref 65–99)
Glucose-Capillary: 74 mg/dL (ref 65–99)
Glucose-Capillary: 161 mg/dL — ABNORMAL HIGH (ref 65–99)
Glucose-Capillary: 125 mg/dL — ABNORMAL HIGH (ref 65–99)

## 2017-05-10 MED ORDER — FUROSEMIDE 10 MG/ML IJ SOLN
60.0000 mg | Freq: Four times a day (QID) | INTRAMUSCULAR | Status: DC
  Administered 2017-05-10 – 2017-05-11 (×5): 60 mg via INTRAVENOUS
  Filled 2017-05-10 (×5): qty 6

## 2017-05-10 NOTE — Progress Notes (Signed)
   05/10/17 1300  PT Visit Information  Last PT Received On 05/10/17  Reason Eval/Treat Not Completed Other (comment) (attempted, pt eating lunch)

## 2017-05-10 NOTE — Clinical Social Work Note (Signed)
Clinical Social Work Assessment  Patient Details  Name: Brittany Reid MRN: [id number] Date of Birth: [birth year]  Date of referral:  05/10/17               Reason for consult:  Facility Placement                Permission sought to share information with:  Family Supports Permission granted to share information::  Yes, Verbal Permission Granted  Name::     husband Product manager::     Relationship::     Contact Information:     Housing/Transportation Living arrangements for the past 2 months:  Single Family Home Source of Information:  Patient, Spouse Patient Interpreter Needed:  None Criminal Activity/Legal Involvement Pertinent to Current Situation/Hospitalization:  No - Comment as needed Significant Relationships:  Spouse, Friend Lives with:  Spouse Do you feel safe going back to the place where you live?  Yes Need for family participation in patient care:  No (Coment)  Care giving concerns:  Pt lives at home with her husband. States prior to this admission she was ambulating independently, "and when she became weak she would hold onto rails or furniture." Prior to this no care needs were present.  Pt prepared food, completed ADLs independently as well.  Both pt and husband report they wish for pt to be able to return home at DC but are concerned as husband "just got released from the hospital a couple of weeks ago and isn't quite as strong as usual yet either."  Facilities manager / plan:  CSW consulted to assist with SNF placement for ST rehab. Met with pt and husband at bedside. Both engaged and open to CSW involvement. Agreed to SNF referrals although they share hope that pt will progress during hospital stay and be able to return home without concern.  Obtained PASSR, completed FL2 and made referrals. Prefer [place] area if available.  Plan: Referred to SNF for ST rehab. Pt agreeable but hopes she will be able to increase her mobility prior to DC and return home.    Employment status:  Unemployed Nurse, adult PT Recommendations:  [place] / Referral to community resources:  [place]  Patient/Family's Response to care:  Both pt and husband share they "love the [facility] system, great care"  Patient/Family's Understanding of and Emotional Response to Diagnosis, Current Treatment, and Prognosis:  Both pt and husband demonstrate adequate understanding of treatment and potential DC plans. They asked pertinent questions about referral process, insurance authorization, and components of rehab. Pt is hesitant to consider rehab at SNF but reports "I want to do whatever is best for me, and if I'm not getting around better by then, I know I need to consider it."  Husband expressed reluctance to have pt enter SNF due to "watching how his own parents did in those type of facilities" but also shares "I want whatever is the best care for her"  Emotional Assessment Appearance:  Appears younger than stated age Attitude/Demeanor/Rapport:   (pleasant) Affect (typically observed):  Accepting, Adaptable, Calm Orientation:  Oriented to Self, Oriented to Place, Oriented to  Time, Oriented to Situation Alcohol / Substance use:  Not Applicable Psych involvement (Current and /or in the community):  No (Comment)  Discharge Needs  Concerns to be addressed:  Discharge Planning Concerns Readmission within the last 30 days:  No Current discharge risk:  Dependent with Mobility Barriers to Discharge:  Continued  Medical Work up, [employer], LCSW 05/10/2017, 2:07 PM  [phone]

## 2017-05-10 NOTE — Progress Notes (Signed)
[facility] Progress Note  Patient: Brittany Reid [id number]:[id number]   PCP: Drake Leach, MD DOB: [birth year]   DOA: 05/05/2017   DOS: 05/10/2017   Date of Service: the patient was seen and examined on 05/10/2017  Subjective: Fatigue is getting better. Appetite is improving. No nausea no vomiting.  Brief hospital course: Pt. with PMH of hypertension, diabetes mellitus on oral hypoglycemics, chronic ITP; admitted on 05/05/2017, presented with complaint of shortness of breath, was found to have anasarca. Currently further plan is continue diuresis and further workup of anasarca.  Assessment and Plan: Acute CHF /anasarca -Patient clinically looks fluid overloaded with bilateral lower extremity pitting edema, abdominal swelling suggesting ascites -Admit on heart failure pathway, continue IV Lasix 40 mg every 12 hours, strict ins and outs, daily weights -Continue home beta-blocker, hold ACE inhibitor for now Echocardiogram shows preserved EF, diastolic dysfunction with hyperdynamic circulation. Patient is already on Lopressor and continue that.  Cirrhosis of the liver. Ascites MELD score 12, CPC class B. Likely contributing to patient's anasarca. No significant hypoalbuminemia. 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 No history of alcohol abuse. New-onset ascites,  Ultrasound paracentesis suggest portal hypertension causing ascites likely from cirrhosis of the liver. No evidence of SBP. Cytology currently pending. she'll need outpatient follow-up with GI for screening EGD. Add aldactone and continue lasix. Increasing Lasix frequency for now.  Acute hypoxic respiratory failure -PE is on differential for hypoxia/shortness of breath as her CXR wasn't that obvious for pulmonary edema.  D dimer extremely elevated, negative CT angio for PE  Elevated liver enzymes -Patient has slightly elevation in her AST/ALT/bilirubin, her albumin is slightly low and she is thrombocytopenic. Continue to monitor and  avoid hepatotoxic medications.  Chronic ITP -Followed by hematology as an outpatient. Labs appear stable from care everywhere   Type 2 diabetes mellitus -Hold metformin for now, place patient on sliding scale  Hypertension -Resume home metoprolol  Low cortisol. AM cortisol 6.8. normal cosyntropin stimulation test.  Hypokalemia, hypomagnesemia. Now stable Now on aldactone   Mild elevation of serum free T4. Recommended recheck in 1 month once acute issues are resolved.  Diet: cardiac diet DVT Prophylaxis: scd Advance goals of care discussion: full code  Family Communication: family was present at bedside, at the time of interview.   Disposition:  Discharge to SNF in 1-2 days pending volume status.  Consultants: none Procedures: Echocardiogram   Antibiotics: Anti-infectives    None       Objective: Physical Exam: Vitals:   05/10/17 0614 05/10/17 0851 05/10/17 1225 05/10/17 1844  BP: 114/62 (!) 146/66 (!) 117/58 (!) 106/52  Pulse: 99 94 80 88  Resp: 18 16 17 18   Temp: 98.7 F (37.1 C) 97.8 F (36.6 C) 97.9 F (36.6 C) 98.5 F (36.9 C)  TempSrc: Oral Oral Oral Oral  SpO2: 96% 95% 97% 95%  Weight: 101.4 kg (223 lb 8.7 oz)     Height:        Intake/Output Summary (Last 24 hours) at 05/10/17 1905 Last data filed at 05/10/17 1800  Gross per 24 hour  Intake              480 ml  Output              725 ml  Net             -245 ml   Filed Weights   05/08/17 0429 05/09/17 0458 05/10/17 0614  Weight: 103.5 kg (228 lb 2.8 oz) 102.7 kg (226 lb 6.6  oz) 101.4 kg (223 lb 8.7 oz)   General: Alert, Awake and Oriented to Time, Place and Person. Appear in moderate distress, affect appropriate Eyes: PERRL, Conjunctiva normal ENT: Oral Mucosa clear moist. Neck: positive JVD, no Abnormal Mass Or lumps Cardiovascular: S1 and S2 Present, no Murmur, Peripheral Pulses Present Respiratory: normal respiratory effort, Bilateral Air entry equal and Decreased, no use of  accessory muscle, basal Crackles, no wheezes Abdomen: Bowel Sound present, Soft and no tenderness, no hernia Skin: no redness, no Rash, no induration Extremities: bilateral Pedal edema, no calf tenderness Neurologic: Grossly no focal neuro deficit. Bilaterally Equal motor strength  Data Reviewed: CBC:  Recent Labs Lab 05/05/17 1256 05/07/17 0450 05/08/17 0627 05/09/17 0459 05/10/17 0433  WBC 6.6 6.1 6.7 6.7 5.7  NEUTROABS 3.8  --   --   --   --   HGB 15.5* 13.9 14.0 14.2 13.2  HCT 44.6 40.8 41.4 42.2 40.1  MCV 104.7* 105.4* 106.2* 107.1* 105.8*  PLT 91* 65* 74* 76* 61*   Basic Metabolic Panel:  Recent Labs Lab 05/05/17 1256 05/07/17 0450 05/08/17 0627 05/09/17 0459 05/10/17 0433  NA 134* 135 138 138 138  K 3.9 3.3* 3.9 3.9 4.5  CL 96* 99* 99* 98* 100*  CO2 28 30 32 34* 34*  GLUCOSE 131* 105* 118* 98 95  BUN 12 13 13 12 12   CREATININE 0.70 0.50 0.53 0.48 0.50  CALCIUM 8.9 8.3* 8.5* 8.6* 8.6*  MG  --  1.5*  --   --   --     Liver Function Tests:  Recent Labs Lab 05/05/17 1256 05/09/17 0459 05/10/17 0433  AST 87* 79* 72*  ALT 59* 49 44  ALKPHOS 81 63 59  BILITOT 2.3* 1.7* 1.6*  PROT 7.2 6.2* 5.8*  ALBUMIN 2.9* 2.5* 2.5*   No results for input(s): LIPASE, AMYLASE in the last 168 hours. No results for input(s): AMMONIA in the last 168 hours. Coagulation Profile:  Recent Labs Lab 05/09/17 0459  INR 1.35   Cardiac Enzymes:  Recent Labs Lab 05/05/17 1256  TROPONINI 0.04*   BNP (last 3 results) No results for input(s): PROBNP in the last 8760 hours. CBG:  Recent Labs Lab 05/09/17 1648 05/09/17 2125 05/10/17 0757 05/10/17 1221 05/10/17 1625  GLUCAP 111* 101* 74 161* 125*   Studies: No results found.  Scheduled Meds: . amitriptyline  50 mg Oral QHS  . aspirin EC  81 mg Oral Daily  . enoxaparin (LOVENOX) injection  40 mg Subcutaneous Q24H  . furosemide  60 mg Intravenous Q6H  . insulin aspart  0-9 Units Subcutaneous TID WC  .  metoprolol tartrate  25 mg Oral BID  . potassium chloride  30 mEq Oral BID  . simvastatin  20 mg Oral q1800  . sodium chloride flush  3 mL Intravenous Q12H  . spironolactone  100 mg Oral Daily   Continuous Infusions: . sodium chloride     PRN Meds: sodium chloride, acetaminophen, ondansetron (ZOFRAN) IV, sodium chloride flush  Time spent: 35 minutes  Author: Berle Mull, MD [facility] Pager: [phone] 05/10/2017 7:05 PM  If 7PM-7AM, please contact night-coverage at [email or web address], password [email or web address]

## 2017-05-10 NOTE — Care Management Note (Signed)
Case Management Note  Patient Details  Name: Brittany Reid MRN: [id number] Date of Birth: 21-Dec-1940  Subjective/Objective:    Pt admitted with CHF                Action/Plan:Pt plan to discharge to SNF, CSW following   Expected Discharge Date:   (unknown)               Expected Discharge Plan:  Skilled Nursing Facility  In-House Referral:  Clinical Social Work  Discharge planning Services  CM Consult  Post Acute Care Choice:  NA Choice offered to:  Patient  DME Arranged:    DME Agency:     HH Arranged:  NA [place] Agency:     Status of Service:  Completed, signed off  If discussed at [employer], dates discussed:    Additional CommentsPurcell Mouton, RN 05/10/2017, 2:41 PM

## 2017-05-10 NOTE — Progress Notes (Addendum)
Physical Therapy Treatment Patient Details Name: Brittany Reid MRN: [id number] DOB: [birth year] Today's Date: 05/10/2017    History of Present Illness : Brittany Reid is a 76 y.o. female with medical history significant of hypertension, diabetes mellitus on oral hypoglycemics, chronic ITP, who presents to the emergency room with chief complaint of leg swelling and progressive  shortness of breath, CHF    PT Comments    The patient  Was assisted to [facility] , then ambulated x 50' with 2 assist for safety and equipment.  BP supine 120/47, sitting 94/56, standing 88/59, standing 3 mins 90/60. Saturation on RA 94 %down to 87% when  ambulating. Replaced on 3 liters (as was on wall  ). Patient reports very mild dizziness. BP after resting and ambulating 136/75. HR remained in 80's.  Follow Up Recommendations  SNF- pstient stated that she is agreeable to go to rehab     Equipment Recommendations  None recommended by PT    Recommendations for Other Services       Precautions / Restrictions Precautions Precautions: Fall Precaution Comments: monitor sats, BP    Mobility  Bed Mobility Overal bed mobility: Needs Assistance Bed Mobility: Supine to Sit;Sit to Supine     Supine to sit: HOB elevated;Supervision Sit to supine: Min assist   General bed mobility comments: assist with trunk to sitting, self assist back into bed  Transfers Overall transfer level: Needs assistance Equipment used: Rolling walker (2 wheeled) Transfers: Sit to/from [facility] Sit to Stand: Min assist         General transfer comment: transferred bed to [facility], BSC to bed , verbal cues for safety and hand placement  Ambulation/Gait Ambulation/Gait assistance: Min assist;+2 safety/equipment Ambulation Distance (Feet): 50 Feet Assistive device: Rolling walker (2 wheeled) Gait Pattern/deviations: Step-to pattern;Step-through pattern     General Gait Details: decreased speed, close guarding     Stairs            Wheelchair Mobility    Modified Rankin (Stroke Patients Only)       Balance                                            Cognition Arousal/Alertness: Awake/alert Behavior During Therapy: WFL for tasks assessed/performed                                          Exercises      General Comments        Pertinent Vitals/Pain Pain Assessment: No/denies pain    Home Living                      Prior Function            PT Goals (current goals can now be found in the care plan section) Progress towards PT goals: Progressing toward goals    Frequency    Min 2X/week      PT Plan Current plan remains appropriate;Frequency needs to be updated    Co-evaluation              AM-PAC PT "6 Clicks" Daily Activity  Outcome Measure  Difficulty turning over in bed (including adjusting bedclothes, sheets and blankets)?: Unable Difficulty moving from lying on back to sitting  on the side of the bed? : Unable Difficulty sitting down on and standing up from a chair with arms (e.g., wheelchair, bedside commode, etc,.)?: Unable Help needed moving to and from a bed to chair (including a wheelchair)?: A Lot Help needed walking in hospital room?: A Lot Help needed climbing 3-5 steps with a railing? : Total 6 Click Score: 8    End of Session Equipment Utilized During Treatment: Gait belt;Oxygen Activity Tolerance: Patient tolerated treatment well Patient left: in bed;with call bell/phone within reach;with bed alarm set;with family/visitor present Nurse Communication: Mobility status PT Visit Diagnosis: Unsteadiness on feet (R26.81)     Time: 2233-6122 PT Time Calculation (min) (ACUTE ONLY): 34 min  Charges:  $Gait Training: 23-37 mins                    G CodesTresa Endo PT [phone]    Claretha Cooper 05/10/2017, 3:02 PM

## 2017-05-10 NOTE — NC FL2 (Signed)
[place] LEVEL OF CARE SCREENING TOOL     IDENTIFICATION  Patient Name: Brittany Reid Birthdate: 06-Nov-1940 Sex: female Admission Date (Current Location): 05/05/2017  [facility] and [place] Number:  Herbalist and Address:  [facility],  [place] [place], [place]      Provider Number: [id number]  Attending Physician Name and Address:  Lavina Hamman, MD  Relative Name and Phone Number:       Current Level of Care: Hospital Recommended Level of Care: [place] Prior Approval Number:    Date Approved/Denied:   PASRR Number: [id number]  Discharge Plan: SNF    Current Diagnoses: Patient Active Problem List   Diagnosis Date Noted  . CHF (congestive heart failure) ([place]) 05/05/2017  . Diabetes mellitus ([place]) 05/05/2017  . HTN (hypertension) 05/05/2017  . Obesity, diabetes, and hypertension syndrome ([place]) 05/05/2017  . Chronic ITP (idiopathic thrombocytopenia) (HCC) 05/05/2017  . Elevated liver enzymes 05/05/2017  . Respiratory failure with hypoxia ([place]) 05/05/2017    Orientation RESPIRATION BLADDER Height & Weight     Self, Time, Situation, Place  O2 (3L) External catheter Weight: 223 lb 8.7 oz (101.4 kg) Height:  5\' 4"  (162.6 cm)  BEHAVIORAL SYMPTOMS/MOOD NEUROLOGICAL BOWEL NUTRITION STATUS      Continent Diet (heart healthy, carb modified)  AMBULATORY STATUS COMMUNICATION OF NEEDS Skin   Extensive Assist Verbally Normal                       Personal Care Assistance Level of Assistance  Bathing, Feeding, Dressing Bathing Assistance: Limited assistance Feeding assistance: Independent Dressing Assistance: Limited assistance     Functional Limitations Info  Sight, Hearing, Speech Sight Info: Adequate Hearing Info: Adequate Speech Info: Adequate    SPECIAL CARE FACTORS FREQUENCY  PT (By licensed PT), OT (By licensed OT)     PT Frequency: 5x OT Frequency: 5x            Contractures  Contractures Info: Not present    Additional Factors Info  Code Status, Allergies Code Status Info: full Allergies Info: sulfur           Current Medications (05/10/2017):  This is the current hospital active medication list Current Facility-Administered Medications  Medication Dose Route Frequency Provider Last Rate Last Dose  . 0.9 %  sodium chloride infusion  250 mL Intravenous PRN Caren Griffins, MD      . acetaminophen (TYLENOL) tablet 650 mg  650 mg Oral Q4H PRN Caren Griffins, MD      . amitriptyline (ELAVIL) tablet 50 mg  50 mg Oral QHS Caren Griffins, MD   50 mg at 05/09/17 2201  . aspirin EC tablet 81 mg  81 mg Oral Daily Caren Griffins, MD   81 mg at 05/10/17 0907  . enoxaparin (LOVENOX) injection 40 mg  40 mg Subcutaneous Q24H Caren Griffins, MD   40 mg at 05/10/17 0906  . furosemide (LASIX) injection 60 mg  60 mg Intravenous Q6H Lavina Hamman, MD   60 mg at 05/10/17 1304  . insulin aspart (novoLOG) injection 0-9 Units  0-9 Units Subcutaneous TID WC Caren Griffins, MD   2 Units at 05/10/17 1303  . metoprolol tartrate (LOPRESSOR) tablet 25 mg  25 mg Oral BID Caren Griffins, MD   25 mg at 05/10/17 0907  . ondansetron (ZOFRAN) injection 4 mg  4 mg Intravenous Q6H PRN Caren Griffins, MD      .  potassium chloride (K-DUR,KLOR-CON) CR tablet 30 mEq  30 mEq Oral BID Lavina Hamman, MD   30 mEq at 05/10/17 7062  . simvastatin (ZOCOR) tablet 20 mg  20 mg Oral q1800 Caren Griffins, MD   20 mg at 05/09/17 1727  . sodium chloride flush (NS) 0.9 % injection 3 mL  3 mL Intravenous Q12H Caren Griffins, MD   3 mL at 05/10/17 1000  . sodium chloride flush (NS) 0.9 % injection 3 mL  3 mL Intravenous PRN Caren Griffins, MD      . spironolactone (ALDACTONE) tablet 100 mg  100 mg Oral Daily Lavina Hamman, MD   100 mg at 05/10/17 0907     Discharge Medications: Please see discharge summary for a list of discharge medications.  Relevant Imaging  Results:  Relevant Lab Results:   Additional Information SS# [id number]  Nila Nephew, LCSW

## 2017-05-11 DIAGNOSIS — I5031 Acute diastolic (congestive) heart failure: Secondary | ICD-10-CM

## 2017-05-11 DIAGNOSIS — R601 Generalized edema: Secondary | ICD-10-CM

## 2017-05-11 DIAGNOSIS — J9601 Acute respiratory failure with hypoxia: Secondary | ICD-10-CM

## 2017-05-11 DIAGNOSIS — R188 Other ascites: Secondary | ICD-10-CM

## 2017-05-11 DIAGNOSIS — K7290 Hepatic failure, unspecified without coma: Secondary | ICD-10-CM

## 2017-05-11 LAB — CBC
HCT: 41 % (ref 36.0–46.0)
Hemoglobin: 13.9 g/dL (ref 12.0–15.0)
MCH: 35.6 pg — ABNORMAL HIGH (ref 26.0–34.0)
MCHC: 33.9 g/dL (ref 30.0–36.0)
MCV: 105.1 fL — ABNORMAL HIGH (ref 78.0–100.0)
PLATELETS: 64 10*3/uL — AB (ref 150–400)
RBC: 3.9 MIL/uL (ref 3.87–5.11)
RDW: 16.5 % — AB (ref 11.5–15.5)
WBC: 5.6 10*3/uL (ref 4.0–10.5)

## 2017-05-11 LAB — COMPREHENSIVE METABOLIC PANEL
ALBUMIN: 2.5 g/dL — AB (ref 3.5–5.0)
ALT: 45 U/L (ref 14–54)
AST: 72 U/L — AB (ref 15–41)
Alkaline Phosphatase: 62 U/L (ref 38–126)
Anion gap: 5 (ref 5–15)
BUN: 12 mg/dL (ref 6–20)
CHLORIDE: 99 mmol/L — AB (ref 101–111)
CO2: 34 mmol/L — ABNORMAL HIGH (ref 22–32)
Calcium: 8.4 mg/dL — ABNORMAL LOW (ref 8.9–10.3)
Creatinine, Ser: 0.54 mg/dL (ref 0.44–1.00)
GFR calc Af Amer: >60 mL/min (ref >60)
GFR calc non Af Amer: >60 mL/min (ref >60)
GLUCOSE: 97 mg/dL (ref 65–99)
POTASSIUM: 4.1 mmol/L (ref 3.5–5.1)
Sodium: 138 mmol/L (ref 135–145)
Total Bilirubin: 1.7 mg/dL — ABNORMAL HIGH (ref 0.3–1.2)
Total Protein: 6 g/dL — ABNORMAL LOW (ref 6.5–8.1)

## 2017-05-11 LAB — GLUCOSE, CAPILLARY
GLUCOSE-CAPILLARY: 104 mg/dL — AB (ref 65–99)
Glucose-Capillary: 79 mg/dL (ref 65–99)
Glucose-Capillary: 148 mg/dL — ABNORMAL HIGH (ref 65–99)
Glucose-Capillary: 130 mg/dL — ABNORMAL HIGH (ref 65–99)

## 2017-05-11 LAB — HEPATITIS PANEL, ACUTE
HCV Ab: 0.3 s/co ratio (ref 0.0–0.9)
HEP B C IGM: NEGATIVE
Hep A IgM: NEGATIVE
Hepatitis B Surface Ag: NEGATIVE

## 2017-05-11 MED ORDER — ALBUMIN HUMAN 25 % IV SOLN
50.0000 g | Freq: Once | INTRAVENOUS | Status: AC
  Administered 2017-05-11: 50 g via INTRAVENOUS
  Filled 2017-05-11: qty 200

## 2017-05-11 MED ORDER — FUROSEMIDE 10 MG/ML IJ SOLN
80.0000 mg | Freq: Four times a day (QID) | INTRAMUSCULAR | Status: DC
  Administered 2017-05-11 – 2017-05-12 (×4): 80 mg via INTRAVENOUS
  Filled 2017-05-11 (×4): qty 8

## 2017-05-11 NOTE — Progress Notes (Signed)
Met with pt and husband at bedside to discuss SNF bed offers. [place]. CSW selected facility in the [place] and confirmed with admissions.  Left voicemail with [employer] to initiate insurance pre-authorization.  Sharren Bridge, MSW, LCSW Clinical Social Work 05/11/2017 [phone]

## 2017-05-11 NOTE — Care Management Important Message (Signed)
Important Message  Patient Details  Name: Brittany Reid MRN: [id number] Date of Birth: 1940-12-26   Medicare Important Message Given:  Yes    Kerin Salen 05/11/2017, 10:14 AMImportant Message  Patient Details  Name: Brittany Reid MRN: [id number] Date of Birth: 25-Oct-1940   Medicare Important Message Given:  Yes    Kerin Salen 05/11/2017, 10:14 AM

## 2017-05-11 NOTE — Progress Notes (Signed)
PROGRESS NOTE                                                                                                                                                                                                             Patient Demographics:    Brittany Reid, is a 76 y.o. female, DOB - 1941-03-17, SPQ:[id number]  Admit date - 05/05/2017   Admitting Physician Lavina Hamman, MD  Outpatient Primary MD for the patient is Drake Leach, MD  LOS - 6  Outpatient Specialists: Dr  Chief Complaint  Patient presents with  . Shortness of Breath  . Leg Swelling       Brief Narrative     Subjective:   Breathing better but still has leg swelling and some abdominal distention.     Assessment  & Plan :    Active Problems:   Acute diastolic CHF (congestive heart failure) (HCC)  2-D echo with EF of 65-70% and grade 1 diastolic dysfunction. Currently on IV Lasix 60 mg every 6 hours but still has significant leg edema. I'll increase her Lasix dose to 80 mg every 6 hours. Monitor strict I/O and daily weight. Monitor electrolytes.  Decompensated liver cirrhosis with anasarca New onset. SAAG >1. Diagnostic and therapeutic paracentesis with 1 L ascites fluid removed, negative for SBP. Cultures negative and cytology pending. Denies alcohol use or exposure to IV drugs as blood product. Hepatitis panel negative. 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 Has low platelet and mildly elevated INR. Continue Lasix. Added Aldactone. Transaminases improved. Has low albumin. I will order a dose of IV albumin. Had routine coloscopy by Dr Colon Branch at [facility] in July, reportedly was normal. I have called the office and schedule appointment with him on 9/27.    Diabetes mellitus ([place])   HTN (hypertension)   Obesity, diabetes, and hypertension syndrome (HCC)   Chronic ITP (idiopathic thrombocytopenia) (HCC) Low platelets, stable.  Acute  respiratory failure with hypoxia (HCC) Secondary to diastolic CHF and anasarca. With oxygen as tolerated.  Type 2 diabetes mellitus Metformin on hold. Monitor on sliding scale coverage.  Essential hypertension Stable. Continue metoprolol.  Low cortisol Normal cosyntropin stim edition test.  Hypokalemia/hypomagnesemia Replenish  Mildly elevated free T4 Normal TSH. Repeat in 4-6 weeks.  Generalized weakness Seen by PT and recommends SNF.  Code Status : full code  Family Communication  : none at bedside  Disposition Plan  : SNF once diuresed well , possibly in 1-2 days  Barriers For Discharge : Active symptoms  Consults  :  IR  Procedures  :  Paracentesis Ultrasound abdomen 2-D echo  DVT Prophylaxis  :  SCDs  Lab Results  Component Value Date   PLT 64 (L) 05/11/2017    Antibiotics  :    Anti-infectives    None        Objective:   Vitals:   05/10/17 1844 05/10/17 2053 05/11/17 0440 05/11/17 0747  BP: (!) 106/52 120/64 100/67 (!) 130/59  Pulse: 88 90 83 80  Resp: 18 20 20 14   Temp: 98.5 F (36.9 C) 98.1 F (36.7 C) 97.7 F (36.5 C) 97.7 F (36.5 C)  TempSrc: Oral Oral Oral Oral  SpO2: 95% 95%  97%  Weight:   101.4 kg (223 lb 8.7 oz)   Height:        Wt Readings from Last 3 Encounters:  05/11/17 101.4 kg (223 lb 8.7 oz)     Intake/Output Summary (Last 24 hours) at 05/11/17 0947 Last data filed at 05/11/17 0600  Gross per 24 hour  Intake              240 ml  Output              725 ml  Net             -485 ml     Physical Exam  Gen: not in distress HEENT: no pallor, moist mucosa, supple neck, no JVD Chest: cfine bibasilar crackles CVS: N S1&S2, no murmurs, rubs or gallop GI: soft, NT, distended with ascites, BS+ Musculoskeletal: warm, 2 + pitting edema b/l     Data Review:    CBC  Recent Labs Lab 05/05/17 1256 05/07/17 0450 05/08/17 0627 05/09/17 0459 05/10/17 0433 05/11/17 0643  WBC 6.6 6.1 6.7 6.7 5.7 5.6  HGB 15.5*  13.9 14.0 14.2 13.2 13.9  HCT 44.6 40.8 41.4 42.2 40.1 41.0  PLT 91* 65* 74* 76* 61* 64*  MCV 104.7* 105.4* 106.2* 107.1* 105.8* 105.1*  MCH 36.4* 35.9* 35.9* 36.0* 34.8* 35.6*  MCHC 34.8 34.1 33.8 33.6 32.9 33.9  RDW 16.8* 17.0* 17.0* 16.8* 16.5* 16.5*  LYMPHSABS 1.5  --   --   --   --   --   MONOABS 1.0  --   --   --   --   --   EOSABS 0.2  --   --   --   --   --   BASOSABS 0.1  --   --   --   --   --     Chemistries   Recent Labs Lab 05/05/17 1256 05/07/17 0450 05/08/17 0627 05/09/17 0459 05/10/17 0433 05/11/17 0643  NA 134* 135 138 138 138 138  K 3.9 3.3* 3.9 3.9 4.5 4.1  CL 96* 99* 99* 98* 100* 99*  CO2 28 30 32 34* 34* 34*  GLUCOSE 131* 105* 118* 98 95 97  BUN 12 13 13 12 12 12   CREATININE 0.70 0.50 0.53 0.48 0.50 0.54  CALCIUM 8.9 8.3* 8.5* 8.6* 8.6* 8.4*  MG  --  1.5*  --   --   --   --   AST 87*  --   --  79* 72* 72*  ALT 59*  --   --  49 44 45  ALKPHOS 81  --   --  63 59 62  BILITOT 2.3*  --   --  1.7* 1.6* 1.7*   ------------------------------------------------------------------------------------------------------------------ No results for input(s): CHOL, HDL, LDLCALC, TRIG, CHOLHDL, LDLDIRECT in the last 72 hours.  No results found for: HGBA1C ------------------------------------------------------------------------------------------------------------------ No results for input(s): TSH, T4TOTAL, T3FREE, THYROIDAB in the last 72 hours.  Invalid input(s): FREET3 ------------------------------------------------------------------------------------------------------------------ No results for input(s): VITAMINB12, FOLATE, FERRITIN, TIBC, IRON, RETICCTPCT in the last 72 hours.  Coagulation profile  Recent Labs Lab 05/09/17 0459  INR 1.35    No results for input(s): DDIMER in the last 72 hours.  Cardiac Enzymes  Recent Labs Lab 05/05/17 1256  TROPONINI 0.04*    ------------------------------------------------------------------------------------------------------------------    Component Value Date/Time   BNP 43.1 05/05/2017 1256    Inpatient Medications  Scheduled Meds: . amitriptyline  50 mg Oral QHS  . aspirin EC  81 mg Oral Daily  . enoxaparin (LOVENOX) injection  40 mg Subcutaneous Q24H  . furosemide  80 mg Intravenous Q6H  . insulin aspart  0-9 Units Subcutaneous TID WC  . metoprolol tartrate  25 mg Oral BID  . potassium chloride  30 mEq Oral BID  . simvastatin  20 mg Oral q1800  . sodium chloride flush  3 mL Intravenous Q12H  . spironolactone  100 mg Oral Daily   Continuous Infusions: . sodium chloride     PRN Meds:.sodium chloride, acetaminophen, ondansetron (ZOFRAN) IV, sodium chloride flush  Micro Results Recent Results (from the past 240 hour(s))  Culture, body fluid-bottle     Status: None (Preliminary result)   Collection Time: 05/08/17 11:49 AM  Result Value Ref Range Status   Specimen Description FLUID PERITONEAL  Final   Special Requests NONE  Final   Culture   Final    NO GROWTH 3 DAYS Performed at [facility] [address], [place], [place] [address]    Report Status PENDING  Incomplete  Gram stain     Status: None   Collection Time: 05/08/17 11:49 AM  Result Value Ref Range Status   Specimen Description FLUID PERITONEAL  Final   Special Requests NONE  Final   Gram Stain   Final    FEW WBC PRESENT,BOTH PMN AND MONONUCLEAR NO ORGANISMS SEEN Performed at [facility], [address], [place], [place] [address]    Report Status 05/08/2017 FINAL  Final    Radiology Reports Dg Chest 2 View  Result Date: 05/05/2017 CLINICAL DATA:  Shortness of breath with exertion, BILATERAL leg swelling for 2 weeks, former smoker, diabetes mellitus, hypertension EXAM: CHEST  2 VIEW COMPARISON:  05/14/2016 FINDINGS: Upper normal heart size. Mediastinal contours and pulmonary vascularity normal. Bibasilar  atelectasis with decreased lung volumes versus previous study. Atherosclerotic calcification aortic arch. No definite infiltrate, pleural effusion or pneumothorax. Bones demineralized with scattered degenerative disc disease changes of the thoracic spine and BILATERAL AC joint degenerative changes. IMPRESSION: Decreased lung volumes with bibasilar atelectasis. Electronically Signed   By: Lavonia Dana M.D.   On: 05/05/2017 13:40   Ct Angio Chest Pe W Or Wo Contrast  Result Date: 05/05/2017 CLINICAL DATA:  Shortness of breath with exertion and BILATERAL leg swelling for 2 weeks ; history diabetes mellitus, hypertension, CHF EXAM: CT ANGIOGRAPHY CHEST WITH CONTRAST TECHNIQUE: Multidetector CT imaging of the chest was performed using the standard protocol during bolus administration of intravenous contrast. Multiplanar CT image reconstructions and MIPs were obtained to evaluate the vascular anatomy. CONTRAST:  100 cc Isovue 370 IV COMPARISON:  None FINDINGS:  Cardiovascular: Suboptimal opacification of the pulmonary arteries likely due to a combination of slightly delayed imaging and less tight contrast bolus. However pulmonary arteries are adequately opacified for diagnosis. Pulmonary arteries appear grossly patent without evidence of pulmonary embolism. Aorta normal caliber with scattered atherosclerotic calcifications. No pericardial effusion. Cardiac chambers appear mildly enlarged. Coronary arterial calcifications are also present. Mediastinum/Nodes: Small hiatal hernia. Esophagus grossly unremarkable. Base of cervical region normal appearance. Scattered normal sized mediastinal lymph nodes without thoracic adenopathy. Lungs/Pleura: Small BILATERAL pleural effusions. Scattered interstitial prominence question representing mild pulmonary edema. Bibasilar atelectasis. Calcified granulomata RIGHT lung. No pneumothorax. Upper Abdomen: Small nodular cirrhotic appearing liver with significant upper abdominal ascites.  Cholelithiasis. Musculoskeletal: Degenerative disc disease changes thoracic spine. Review of the MIP images confirms the above findings. IMPRESSION: Small BILATERAL pleural effusions and bibasilar atelectasis and probable mild pulmonary edema. No gross evidence of pulmonary embolism. Small cirrhotic appearing liver with significant upper abdominal ascites. Small hiatal hernia. Coronary arterial calcifications. Aortic Atherosclerosis (ICD10-I70.0). Electronically Signed   By: Lavonia Dana M.D.   On: 05/05/2017 19:13   US Pelvic Doppler (torsion R/o Or Mass Arterial Flow)  Result Date: 05/06/2017 CLINICAL DATA:  Anasarca, cirrhosis. EXAM: DUPLEX ULTRASOUND OF LIVER TECHNIQUE: Color and duplex Doppler ultrasound was performed to evaluate the hepatic in-flow and out-flow vessels. COMPARISON:  None. FINDINGS: Portal Vein Velocities, all hepatopetal Main: proximal portal vein 23.1 cm/second, mid 24.7 cm/second and distal 14.3 cm/seconds Right:  18.8 cm/sec Left:  19.9 cm/sec Hepatic Vein Velocities, all hepatofugal Right:  17.8 cm/sec Middle:  27.4 cm/sec Left:  7.6 cm/sec Intrahepatic IVC: Patent with velocity of 14.1 cm/second Hepatic Artery Velocity:  38.6 cm/sec Splenic Vein Velocity:  7.7 cm/sec Varices: Absent Ascites: Moderate to large volume of ascites with cirrhosis of the liver. IMPRESSION: Hepatopetal flow without thrombosis of the portal vein. Moderate to large amount of ascites noted with cirrhotic appearing liver. Electronically Signed   By: Ashley Royalty M.D.   On: 05/06/2017 19:26   US Paracentesis  Result Date: 05/08/2017 INDICATION: Patient with past medical history of CHF and cirrhosis, now with ascites. Request is made for diagnostic and therapeutic paracentesis of up to 1 liter. EXAM: ULTRASOUND GUIDED DIAGNOSTIC AND THERAPEUTIC PARACENTESIS MEDICATIONS: 10 mL 1% lidocaine COMPLICATIONS: None immediate. PROCEDURE: Informed written consent was obtained from the patient after a discussion of the  risks, benefits and alternatives to treatment. A timeout was performed prior to the initiation of the procedure. Initial ultrasound scanning demonstrates a large amount of ascites within the right lateral quadrant. The right lateral abdomen was prepped and draped in the usual sterile fashion. 1% lidocaine was used for local anesthesia. Following this, a 19 gauge, 7-cm, Yueh catheter was introduced. An ultrasound image was saved for documentation purposes. The paracentesis was performed. The catheter was removed and a dressing was applied. The patient tolerated the procedure well without immediate post procedural complication. FINDINGS: A total of approximately 1.0 liters of hazy, yellow fluid was removed. Samples were sent to the laboratory as requested by the clinical team. IMPRESSION: Successful ultrasound-guided diagnostic and therapeutic paracentesis yielding 1.0 liters of peritoneal fluid. Read by:  Brynda Greathouse PA-C Electronically Signed   By: Jerilynn Mages.  Shick M.D.   On: 05/08/2017 12:05   US Abdomen Limited Ruq  Result Date: 05/06/2017 CLINICAL DATA:  Anasarca EXAM: ULTRASOUND ABDOMEN LIMITED RIGHT UPPER QUADRANT COMPARISON:  None. FINDINGS: Gallbladder: There is a nonshadowing echogenic focus in the gallbladder neck measuring 7.5 mm possibly representing a small polyp or  adherent stone. No secondary signs of acute cholecystitis. The gallbladder wall is not thickened at 2.7 mm. Common bile duct: Diameter: Normal at 2.9 mm Liver: Cirrhotic appearing liver with surface nodularity. No discrete mass Portal vein is patent on color Doppler imaging with normal direction of blood flow towards the liver. Other: Large amount of ascites is seen in the included upper abdomen. IMPRESSION: 1. Cirrhotic appearing liver with ascites. Hepatopetal flow within the main portal vein. 2. 7.5 mm polyp or adherent gallstone noted in the neck of the gallbladder without secondary signs of cholecystitis. Electronically Signed   By:  Ashley Royalty M.D.   On: 05/06/2017 19:20    Time Spent in minutes  35   Louellen Molder M.D on 05/11/2017 at 9:47 AM  Between 7am to 7pm - Pager - [phone]  After 7pm go to [email or web address] - password [facility]  [facility] -  Office  [phone]

## 2017-05-12 DIAGNOSIS — R5381 Other malaise: Secondary | ICD-10-CM | POA: Diagnosis not present

## 2017-05-12 DIAGNOSIS — R278 Other lack of coordination: Secondary | ICD-10-CM | POA: Diagnosis not present

## 2017-05-12 DIAGNOSIS — R748 Abnormal levels of other serum enzymes: Secondary | ICD-10-CM | POA: Diagnosis not present

## 2017-05-12 DIAGNOSIS — I5040 Unspecified combined systolic (congestive) and diastolic (congestive) heart failure: Secondary | ICD-10-CM | POA: Diagnosis not present

## 2017-05-12 DIAGNOSIS — K7460 Unspecified cirrhosis of liver: Secondary | ICD-10-CM

## 2017-05-12 DIAGNOSIS — D693 Immune thrombocytopenic purpura: Secondary | ICD-10-CM | POA: Diagnosis not present

## 2017-05-12 DIAGNOSIS — J9601 Acute respiratory failure with hypoxia: Secondary | ICD-10-CM | POA: Diagnosis present

## 2017-05-12 DIAGNOSIS — I5189 Other ill-defined heart diseases: Secondary | ICD-10-CM | POA: Diagnosis not present

## 2017-05-12 DIAGNOSIS — R008 Other abnormalities of heart beat: Secondary | ICD-10-CM | POA: Diagnosis not present

## 2017-05-12 DIAGNOSIS — R601 Generalized edema: Secondary | ICD-10-CM | POA: Diagnosis not present

## 2017-05-12 DIAGNOSIS — I5033 Acute on chronic diastolic (congestive) heart failure: Secondary | ICD-10-CM | POA: Diagnosis not present

## 2017-05-12 DIAGNOSIS — M6281 Muscle weakness (generalized): Secondary | ICD-10-CM | POA: Diagnosis not present

## 2017-05-12 DIAGNOSIS — E118 Type 2 diabetes mellitus with unspecified complications: Secondary | ICD-10-CM | POA: Diagnosis not present

## 2017-05-12 DIAGNOSIS — I5031 Acute diastolic (congestive) heart failure: Secondary | ICD-10-CM | POA: Diagnosis present

## 2017-05-12 DIAGNOSIS — J9600 Acute respiratory failure, unspecified whether with hypoxia or hypercapnia: Secondary | ICD-10-CM | POA: Diagnosis not present

## 2017-05-12 DIAGNOSIS — R188 Other ascites: Secondary | ICD-10-CM | POA: Diagnosis not present

## 2017-05-12 DIAGNOSIS — R2681 Unsteadiness on feet: Secondary | ICD-10-CM | POA: Diagnosis not present

## 2017-05-12 DIAGNOSIS — K7290 Hepatic failure, unspecified without coma: Secondary | ICD-10-CM | POA: Diagnosis not present

## 2017-05-12 DIAGNOSIS — K7469 Other cirrhosis of liver: Secondary | ICD-10-CM | POA: Diagnosis not present

## 2017-05-12 DIAGNOSIS — I10 Essential (primary) hypertension: Secondary | ICD-10-CM | POA: Diagnosis not present

## 2017-05-12 DIAGNOSIS — E119 Type 2 diabetes mellitus without complications: Secondary | ICD-10-CM | POA: Diagnosis not present

## 2017-05-12 LAB — IRON AND TIBC
IRON: 171 ug/dL — AB (ref 28–170)
Saturation Ratios: 82 % — ABNORMAL HIGH (ref 10.4–31.8)
TIBC: 209 ug/dL — AB (ref 250–450)
UIBC: 38 ug/dL

## 2017-05-12 LAB — CBC
HEMATOCRIT: 37.6 % (ref 36.0–46.0)
HEMOGLOBIN: 12.5 g/dL (ref 12.0–15.0)
MCH: 34.9 pg — ABNORMAL HIGH (ref 26.0–34.0)
MCHC: 33.2 g/dL (ref 30.0–36.0)
MCV: 105 fL — AB (ref 78.0–100.0)
Platelets: 51 10*3/uL — ABNORMAL LOW (ref 150–400)
RBC: 3.58 MIL/uL — ABNORMAL LOW (ref 3.87–5.11)
RDW: 16.3 % — ABNORMAL HIGH (ref 11.5–15.5)
WBC: 5.3 10*3/uL (ref 4.0–10.5)

## 2017-05-12 LAB — GLUCOSE, CAPILLARY
Glucose-Capillary: 96 mg/dL (ref 65–99)
Glucose-Capillary: 136 mg/dL — ABNORMAL HIGH (ref 65–99)

## 2017-05-12 LAB — COMPREHENSIVE METABOLIC PANEL
ALBUMIN: 3 g/dL — AB (ref 3.5–5.0)
ALT: 39 U/L (ref 14–54)
ANION GAP: 8 (ref 5–15)
AST: 72 U/L — AB (ref 15–41)
Alkaline Phosphatase: 52 U/L (ref 38–126)
BILIRUBIN TOTAL: 1.8 mg/dL — AB (ref 0.3–1.2)
BUN: 10 mg/dL (ref 6–20)
CHLORIDE: 101 mmol/L (ref 101–111)
CO2: 30 mmol/L (ref 22–32)
Calcium: 8.7 mg/dL — ABNORMAL LOW (ref 8.9–10.3)
Creatinine, Ser: 0.43 mg/dL — ABNORMAL LOW (ref 0.44–1.00)
GFR calc Af Amer: >60 mL/min (ref >60)
GFR calc non Af Amer: >60 mL/min (ref >60)
GLUCOSE: 84 mg/dL (ref 65–99)
POTASSIUM: 4.3 mmol/L (ref 3.5–5.1)
SODIUM: 139 mmol/L (ref 135–145)
TOTAL PROTEIN: 5.6 g/dL — AB (ref 6.5–8.1)

## 2017-05-12 LAB — FERRITIN: FERRITIN: 139 ng/mL (ref 11–307)

## 2017-05-12 MED ORDER — POTASSIUM CHLORIDE CRYS ER 15 MEQ PO TBCR: 30.0000 meq | EXTENDED_RELEASE_TABLET | Freq: Two times a day (BID) | ORAL | 0 refills | Status: AC

## 2017-05-12 MED ORDER — FUROSEMIDE 80 MG PO TABS: 80.0000 mg | ORAL_TABLET | Freq: Two times a day (BID) | ORAL | 0 refills | Status: AC

## 2017-05-12 MED ORDER — SPIRONOLACTONE 100 MG PO TABS
100.0000 mg | ORAL_TABLET | Freq: Every day | ORAL | 0 refills | Status: AC
Start: 2017-05-13 — End: ?

## 2017-05-12 NOTE — Progress Notes (Signed)
Reviewed discharge information with patient and caregiver. Answered all questions. Patient and caregiver able to teach back medications and reasons to contact MD or 911. Patient verbalizes importance of PCP follow up appointment. 

## 2017-05-12 NOTE — Progress Notes (Signed)
Physical Therapy Treatment Patient Details Name: Brittany Reid MRN: [id number] DOB: [birth year] Today's Date: 05/12/2017    History of Present Illness : Brittany Reid is a 76 y.o. female with medical history significant of hypertension, diabetes mellitus on oral hypoglycemics, chronic ITP, who presents to the emergency room with chief complaint of leg swelling and progressive  shortness of breath, CHF    PT Comments    Assisted OOB to amb to bathroom.  Very unsteady.  Assisted in bathroom tehn amb to recliner.  See details below.   Follow Up Recommendations  SNF     Equipment Recommendations  None recommended by PT    Recommendations for Other Services       Precautions / Restrictions Precautions Precautions: Fall Precaution Comments: monitor sats, BP Restrictions Weight Bearing Restrictions: No    Mobility  Bed Mobility Overal bed mobility: Needs Assistance Bed Mobility: Supine to Sit     Supine to sit: Min assist     General bed mobility comments: assist with trunk to sitting with increased time  Transfers Overall transfer level: Needs assistance Equipment used: Rolling walker (2 wheeled) Transfers: Sit to/from [facility] Sit to Stand: Min assist;Mod assist         General transfer comment: assisted OOB and on/off toilet with 50% VC's on safety with turns  Ambulation/Gait Ambulation/Gait assistance: Min assist;Mod assist Ambulation Distance (Feet): 20 Feet (10 feet x 2 to and from bathroom only) Assistive device: Rolling walker (2 wheeled)   Gait velocity: decreased   General Gait Details: amb to bathroom with walker at [place] but required 50% VC's on proper walker to self distance and safety with turns.  Then amb from bathroom to recliner without walker and pt required Mod Assist.  Very unsteady.  Increased lateral sway.  HIGH FALL RISK.   Stairs            Wheelchair Mobility    Modified Rankin (Stroke Patients Only)        Balance                                            Cognition Arousal/Alertness: Awake/alert Behavior During Therapy: WFL for tasks assessed/performed Overall Cognitive Status: Within Functional Limits for tasks assessed                                        Exercises      General Comments        Pertinent Vitals/Pain Pain Assessment: No/denies pain    Home Living                      Prior Function            PT Goals (current goals can now be found in the care plan section) Progress towards PT goals: Progressing toward goals    Frequency    Min 2X/week      PT Plan Current plan remains appropriate;Frequency needs to be updated    Co-evaluation              AM-PAC PT "6 Clicks" Daily Activity  Outcome Measure  Difficulty turning over in bed (including adjusting bedclothes, sheets and blankets)?: Unable Difficulty moving from lying on back to sitting on the  side of the bed? : Unable Difficulty sitting down on and standing up from a chair with arms (e.g., wheelchair, bedside commode, etc,.)?: Unable Help needed moving to and from a bed to chair (including a wheelchair)?: A Lot Help needed walking in hospital room?: A Lot Help needed climbing 3-5 steps with a railing? : Total 6 Click Score: 8    End of Session Equipment Utilized During Treatment: Gait belt;Oxygen Activity Tolerance: Patient tolerated treatment well Patient left: with call bell/phone within reach;with bed alarm set;with family/visitor present;in chair Nurse Communication: Mobility status PT Visit Diagnosis: Unsteadiness on feet (R26.81)     Time: 1504-1364 PT Time Calculation (min) (ACUTE ONLY): 28 min  Charges:  $Gait Training: 8-22 mins $Therapeutic Activity: 8-22 mins                    G Codes:       {Shante Archambeault  PTA [facility]  Acute  Rehab Pager      [phone]

## 2017-05-12 NOTE — Clinical Social Work Placement (Addendum)
Pt discharging to [facility] for [place] rehab. Room 609 report number provided for [phone] All information provided to facility via the [place] ([place] [phone], 7 days, from representative Santiago Glad) Pt will transport via [place] completed medical necessity form and arranged transportation. Husband at bedside and both he and pt aware and agreeable to transfer/DC plan.  See below for placement details  CLINICAL SOCIAL WORK PLACEMENT  NOTE  Date:  05/12/2017  Patient Details  Name: Brittany Reid MRN: [id number] Date of Birth: 1940-12-03  Clinical Social Work is seeking post-discharge placement for this patient at the [place] level of care (*CSW will initial, date and re-position this form in  chart as items are completed):  Yes   Patient/family provided with [place] Work Department's list of facilities offering this level of care within the geographic area requested by the patient (or if unable, by the patient's family).  Yes   Patient/family informed of their freedom to choose among providers that offer the needed level of care, that participate in Medicare, Medicaid or managed care program needed by the patient, have an available bed and are willing to accept the patient.  Yes   Patient/family informed of [facility]'s ownership interest in [facility] and [facility], as well as of the fact that they are under no obligation to receive care at these facilities.  PASRR submitted to EDS on       PASRR number received on 05/10/17     Existing PASRR number confirmed on       FL2 transmitted to all facilities in geographic area requested by pt/family on 05/12/17     FL2 transmitted to all facilities within larger geographic area on       Patient informed that his/her managed care company has contracts with or will negotiate with certain facilities, including the following:        Yes   Patient/family  informed of bed offers received.  Patient chooses bed at [facility]     Physician recommends and patient chooses bed at [facility]    Patient to be transferred to [facility] on 05/12/17.  Patient to be transferred to facility by [facility]     Patient family notified on 05/12/17 of transfer.  Name of family member notified:  husband Brittany Reid     PHYSICIAN       Additional Comment:    _______________________________________________ Nila Nephew, LCSW 05/12/2017, 1:06 PM

## 2017-05-12 NOTE — Discharge Summary (Signed)
Physician Discharge Summary  Brittany Reid BTD:[id number] DOB: 08-25-1941 DOA: 05/05/2017  PCP: Drake Leach, MD  Admit date: 05/05/2017 Discharge date: 05/12/2017  Admitted From: Home Disposition:  SNF  Recommendations for Outpatient Follow-up:  1. Follow up with M.D. at SNF in 1 week. Please check comprehensive metabolic panel (for potassium and renal function while on Lasix and LFTs. 2. Please monitor daily weight along with intake and output. 3. Follow-up with GI Dr Dorrene German in [facility] on 9/27 at 8:15 AM. 4. Please follow labs and serologies sent on 9/6 prior to discharge. 5. Please check TSH and free T4 in 4-6 weeks.   Equipment/Devices: Oxygen (2 L via nasal cannula), wean as tolerated.  Discharge Condition: Fair CODE STATUS: Full code Diet recommendation: heart healthy/ diabetic    Discharge Diagnoses:  Principal Problem:   Acute respiratory failure with hypoxia ([place])  Active Problems:     Anasarca   Acute diastolic CHF (congestive heart failure) (HCC)   Non-alcoholic cirrhosis (HCC)   Obesity, diabetes, and hypertension syndrome (HCC)   Chronic ITP (idiopathic thrombocytopenia) (HCC)   Elevated liver enzymes   Brief narrative/history of present illness Please refer to admission H&P for details, in brief, 76 year old female with diabetes mellitus on metformin, chronic ITP who was admitted with leg swellings and progressive shortness of breath over the past 2 weeks. She also reported gaining about 10 pounds during the interval along with increased abdominal distention. Denies any new medication, alcohol use or prior liver disease. Denied any fever, chills, nausea, vomiting, jaundice, abdominal pain, bowel or urinary symptoms. In the ED vitals were stable. Blood work showed abnormal LFTs with total bilirubin of 2.3, AST of 87, ALT 59 and alkaline phosphatase of 200. Platelets of 91. Patient admitted for new onset decompensated January disease and acute diastolic  CHF.  Hospital course    principal Problems:  Acute respiratory failure with hypoxia   Acute diastolic CHF (congestive heart failure) (HCC)  2-D echo with EF of 65-70% and grade 1 diastolic dysfunction.  diuresed aggressively with IV Lasix 80 mg every 6 hours with good urine output. -Still has leg swellings were symptomatically much improved. Maintaining O2 sat on 2 L and can be weaned at the facility. -I will discharge her on oral Lasix 80 mg twice daily along with Aldactone 100 mg daily. -Labs including potassium, renal function needs to be monitored every 2-3 days and patient is to have her daily with including I/O checked. -Continue metoprolol and statin.  Decompensated liver cirrhosis with anasarca New onset. SAAG >1. Diagnostic and therapeutic paracentesis with 1 L ascites fluid removed, negative for SBP. Cultures negative. Cytology negative for malignant cells. Patient Denies alcohol use or exposure to IV drugs as blood product.  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 Has low platelet and mildly elevated INR. LFTs have improved since admission. -Received high dose IV Lasix, now switched to oral Lasix 80 mg twice daily along with Aldactone 100 mg daily. Received IV albumin for low level. -Although suspicion for nonalcoholic fatty disease is high need to rule out other autoimmune condition that could cause acute decompensated liver cirrhosis. -Labs ordered for iron panel including ferritin, hepatitis panel, ANA, anti-smooth muscle antibody, antimicrosomal antibody, IgG, antimitochondrial antibody, ceruloplasmin and AFP.  -Patient reports having routine coloscopy by Dr Colon Branch at [facility] in July, reportedly was normal. I have called the office and schedule appointment with him on 9/27 at 8:15 AM.     Chronic ITP (idiopathic thrombocytopenia) (HCC) Low platelets,  stable.    Type 2 diabetes mellitus Stable on sliding scale coverage. Resume metformin upon  discharge.  Essential hypertension Stable. Continue metoprolol.  Low cortisol Normal cosyntropin  test.  Hypokalemia/hypomagnesemia Replenished. Continue potassium supplement upon discharge.  Mildly elevated free T4 Normal TSH. Repeat in 4-6 weeks.  Generalized weakness Seen by PT and recommends SNF.    Family Communication  :  discussed with husband on the phone  Disposition Plan  : SNF     Consults  :  IR  Procedures  :  Paracentesis Ultrasound abdomen/ Doppler abdomen 2-D echo  Discharge Instructions   Allergies as of 05/12/2017      Reactions   Sulfur Diarrhea, Nausea And Vomiting      Medication List    STOP taking these medications   hydrochlorothiazide 25 MG tablet Commonly known as:  HYDRODIURIL     TAKE these medications   amitriptyline 25 MG tablet Commonly known as:  ELAVIL Take 50 mg by mouth at bedtime.   furosemide 80 MG tablet Commonly known as:  LASIX Take 1 tablet (80 mg total) by mouth 2 (two) times daily.   metFORMIN 500 MG 24 hr tablet Commonly known as:  GLUCOPHAGE-XR Take 1,000 mg by mouth daily.   metoprolol tartrate 50 MG tablet Commonly known as:  LOPRESSOR Take 25 mg by mouth 2 (two) times daily.   potassium chloride SA 15 MEQ tablet Commonly known as:  KLOR-CON M15 Take 2 tablets (30 mEq total) by mouth 2 (two) times daily.   simvastatin 20 MG tablet Commonly known as:  ZOCOR Take 20 mg by mouth daily.   spironolactone 100 MG tablet Commonly known as:  ALDACTONE Take 1 tablet (100 mg total) by mouth daily.            Discharge Care Instructions        Start     Ordered   05/13/17 0000  spironolactone (ALDACTONE) 100 MG tablet  Daily     05/12/17 1055   05/12/17 0000  potassium chloride (KLOR-CON M15) 15 MEQ tablet  2 times daily     05/12/17 1055   05/12/17 0000  furosemide (LASIX) 80 MG tablet  2 times daily     05/12/17 1055      Contact information for follow-up providers    Alger Memos., MD Follow up on 06/02/2017.   Specialty:  Gastroenterology Why:  8:15 AM Contact information: [address] [place] [address] [phone]            Contact information for after-discharge care    Destination    [facility] Follow up.   Specialty:  [place] information: [address] [place] [address] [phone]                 Allergies  Allergen Reactions  . Sulfur Diarrhea and Nausea And Vomiting        Procedures/Studies: Dg Chest 2 View  Result Date: 05/05/2017 CLINICAL DATA:  Shortness of breath with exertion, BILATERAL leg swelling for 2 weeks, former smoker, diabetes mellitus, hypertension EXAM: CHEST  2 VIEW COMPARISON:  05/14/2016 FINDINGS: Upper normal heart size. Mediastinal contours and pulmonary vascularity normal. Bibasilar atelectasis with decreased lung volumes versus previous study. Atherosclerotic calcification aortic arch. No definite infiltrate, pleural effusion or pneumothorax. Bones demineralized with scattered degenerative disc disease changes of the thoracic spine and BILATERAL AC joint degenerative changes. IMPRESSION: Decreased lung volumes with  bibasilar atelectasis. Electronically Signed   By: Lavonia Dana M.D.   On: 05/05/2017 13:40   Ct Angio Chest Pe W Or Wo Contrast  Result Date: 05/05/2017 CLINICAL DATA:  Shortness of breath with exertion and BILATERAL leg swelling for 2 weeks ; history diabetes mellitus, hypertension, CHF EXAM: CT ANGIOGRAPHY CHEST WITH CONTRAST TECHNIQUE: Multidetector CT imaging of the chest was performed using the standard protocol during bolus administration of intravenous contrast. Multiplanar CT image reconstructions and MIPs were obtained to evaluate the vascular anatomy. CONTRAST:  100 cc Isovue 370 IV COMPARISON:  None FINDINGS: Cardiovascular: Suboptimal opacification of the pulmonary arteries likely due to a combination  of slightly delayed imaging and less tight contrast bolus. However pulmonary arteries are adequately opacified for diagnosis. Pulmonary arteries appear grossly patent without evidence of pulmonary embolism. Aorta normal caliber with scattered atherosclerotic calcifications. No pericardial effusion. Cardiac chambers appear mildly enlarged. Coronary arterial calcifications are also present. Mediastinum/Nodes: Small hiatal hernia. Esophagus grossly unremarkable. Base of cervical region normal appearance. Scattered normal sized mediastinal lymph nodes without thoracic adenopathy. Lungs/Pleura: Small BILATERAL pleural effusions. Scattered interstitial prominence question representing mild pulmonary edema. Bibasilar atelectasis. Calcified granulomata RIGHT lung. No pneumothorax. Upper Abdomen: Small nodular cirrhotic appearing liver with significant upper abdominal ascites. Cholelithiasis. Musculoskeletal: Degenerative disc disease changes thoracic spine. Review of the MIP images confirms the above findings. IMPRESSION: Small BILATERAL pleural effusions and bibasilar atelectasis and probable mild pulmonary edema. No gross evidence of pulmonary embolism. Small cirrhotic appearing liver with significant upper abdominal ascites. Small hiatal hernia. Coronary arterial calcifications. Aortic Atherosclerosis (ICD10-I70.0). Electronically Signed   By: Lavonia Dana M.D.   On: 05/05/2017 19:13   US Pelvic Doppler (torsion R/o Or Mass Arterial Flow)  Result Date: 05/06/2017 CLINICAL DATA:  Anasarca, cirrhosis. EXAM: DUPLEX ULTRASOUND OF LIVER TECHNIQUE: Color and duplex Doppler ultrasound was performed to evaluate the hepatic in-flow and out-flow vessels. COMPARISON:  None. FINDINGS: Portal Vein Velocities, all hepatopetal Main: proximal portal vein 23.1 cm/second, mid 24.7 cm/second and distal 14.3 cm/seconds Right:  18.8 cm/sec Left:  19.9 cm/sec Hepatic Vein Velocities, all hepatofugal Right:  17.8 cm/sec Middle:  27.4 cm/sec  Left:  7.6 cm/sec Intrahepatic IVC: Patent with velocity of 14.1 cm/second Hepatic Artery Velocity:  38.6 cm/sec Splenic Vein Velocity:  7.7 cm/sec Varices: Absent Ascites: Moderate to large volume of ascites with cirrhosis of the liver. IMPRESSION: Hepatopetal flow without thrombosis of the portal vein. Moderate to large amount of ascites noted with cirrhotic appearing liver. Electronically Signed   By: Ashley Royalty M.D.   On: 05/06/2017 19:26   US Paracentesis  Result Date: 05/08/2017 INDICATION: Patient with past medical history of CHF and cirrhosis, now with ascites. Request is made for diagnostic and therapeutic paracentesis of up to 1 liter. EXAM: ULTRASOUND GUIDED DIAGNOSTIC AND THERAPEUTIC PARACENTESIS MEDICATIONS: 10 mL 1% lidocaine COMPLICATIONS: None immediate. PROCEDURE: Informed written consent was obtained from the patient after a discussion of the risks, benefits and alternatives to treatment. A timeout was performed prior to the initiation of the procedure. Initial ultrasound scanning demonstrates a large amount of ascites within the right lateral quadrant. The right lateral abdomen was prepped and draped in the usual sterile fashion. 1% lidocaine was used for local anesthesia. Following this, a 19 gauge, 7-cm, Yueh catheter was introduced. An ultrasound image was saved for documentation purposes. The paracentesis was performed. The catheter was removed and a dressing was applied. The patient tolerated the procedure well without immediate post procedural complication. FINDINGS: A total  of approximately 1.0 liters of hazy, yellow fluid was removed. Samples were sent to the laboratory as requested by the clinical team. IMPRESSION: Successful ultrasound-guided diagnostic and therapeutic paracentesis yielding 1.0 liters of peritoneal fluid. Read by:  Brynda Greathouse PA-C Electronically Signed   By: Jerilynn Mages.  Shick M.D.   On: 05/08/2017 12:05   US Abdomen Limited Ruq  Result Date: 05/06/2017 CLINICAL DATA:   Anasarca EXAM: ULTRASOUND ABDOMEN LIMITED RIGHT UPPER QUADRANT COMPARISON:  None. FINDINGS: Gallbladder: There is a nonshadowing echogenic focus in the gallbladder neck measuring 7.5 mm possibly representing a small polyp or adherent stone. No secondary signs of acute cholecystitis. The gallbladder wall is not thickened at 2.7 mm. Common bile duct: Diameter: Normal at 2.9 mm Liver: Cirrhotic appearing liver with surface nodularity. No discrete mass Portal vein is patent on color Doppler imaging with normal direction of blood flow towards the liver. Other: Large amount of ascites is seen in the included upper abdomen. IMPRESSION: 1. Cirrhotic appearing liver with ascites. Hepatopetal flow within the main portal vein. 2. 7.5 mm polyp or adherent gallstone noted in the neck of the gallbladder without secondary signs of cholecystitis. Electronically Signed   By: Ashley Royalty M.D.   On: 05/06/2017 19:20    2-D echo  Study Conclusions  - Left ventricle: The cavity size was normal. Wall thickness was   normal. Systolic function was vigorous. The estimated ejection   fraction was in the range of 65% to 70%. Wall motion was normal;   there were no regional wall motion abnormalities. Doppler   parameters are consistent with abnormal left ventricular   relaxation (grade 1 diastolic dysfunction).  Impressions:  - Extremely limited; definity used; hyperdynamic LV systolic   function; mild diastolic dysfunction.  Subjective: Reports her breathing to be better. Abdominal distention mild to moderate and unchanged. Lytic swelling slightly better from yesterday. Had over 3 L urinary output after increasing Lasix dose yesterday.  Discharge Exam: Vitals:   05/12/17 0600 05/12/17 0917  BP: 123/63 (!) 147/58  Pulse: 87 91  Resp: 18   Temp: 97.8 F (36.6 C)   SpO2: 97%    Vitals:   05/11/17 1502 05/11/17 2131 05/12/17 0600 05/12/17 0917  BP: (!) 159/87 135/61 123/63 (!) 147/58  Pulse: 83 94 87 91   Resp: 16 18 18    Temp: 98 F (36.7 C) 98.3 F (36.8 C) 97.8 F (36.6 C)   TempSrc: Oral Oral Oral   SpO2: 99% 95% 97%   Weight:   97.4 kg (214 lb 11.7 oz)   Height:        Gen: not in distress HEENT: no pallor, moist mucosa, supple neck, no JVD Chest: cfine bibasilar crackles CVS: N S1&S2, no murmurs, rubs or gallop GI: soft, NT, distended with ascites, BS+ Musculoskeletal: warm, 2 + pitting edema b/l    The results of significant diagnostics from this hospitalization (including imaging, microbiology, ancillary and laboratory) are listed below for reference.     Microbiology: Recent Results (from the past 240 hour(s))  Culture, body fluid-bottle     Status: None (Preliminary result)   Collection Time: 05/08/17 11:49 AM  Result Value Ref Range Status   Specimen Description FLUID PERITONEAL  Final   Special Requests NONE  Final   Culture   Final    NO GROWTH 4 DAYS Performed at [facility], [address], [place], [place] [address]    Report Status PENDING  Incomplete  Gram stain  Status: None   Collection Time: 05/08/17 11:49 AM  Result Value Ref Range Status   Specimen Description FLUID PERITONEAL  Final   Special Requests NONE  Final   Gram Stain   Final    FEW WBC PRESENT,BOTH PMN AND MONONUCLEAR NO ORGANISMS SEEN Performed at [facility], [address] [place], [place] [address]    Report Status 05/08/2017 FINAL  Final     Labs: BNP (last 3 results)  Recent Labs  05/05/17 1256  BNP 47.8   Basic Metabolic Panel:  Recent Labs Lab 05/07/17 0450 05/08/17 0627 05/09/17 0459 05/10/17 0433 05/11/17 0643 05/12/17 0353  NA 135 138 138 138 138 139  K 3.3* 3.9 3.9 4.5 4.1 4.3  CL 99* 99* 98* 100* 99* 101  CO2 30 32 34* 34* 34* 30  GLUCOSE 105* 118* 98 95 97 84  BUN 13 13 12 12 12 10   CREATININE 0.50 0.53 0.48 0.50 0.54 0.43*  CALCIUM 8.3* 8.5* 8.6* 8.6* 8.4* 8.7*  MG 1.5*  --   --   --   --   --    Liver Function  Tests:  Recent Labs Lab 05/05/17 1256 05/09/17 0459 05/10/17 0433 05/11/17 0643 05/12/17 0353  AST 87* 79* 72* 72* 72*  ALT 59* 49 44 45 39  ALKPHOS 81 63 59 62 52  BILITOT 2.3* 1.7* 1.6* 1.7* 1.8*  PROT 7.2 6.2* 5.8* 6.0* 5.6*  ALBUMIN 2.9* 2.5* 2.5* 2.5* 3.0*   No results for input(s): LIPASE, AMYLASE in the last 168 hours. No results for input(s): AMMONIA in the last 168 hours. CBC:  Recent Labs Lab 05/05/17 1256  05/08/17 0627 05/09/17 0459 05/10/17 0433 05/11/17 0643 05/12/17 0353  WBC 6.6  < > 6.7 6.7 5.7 5.6 5.3  NEUTROABS 3.8  --   --   --   --   --   --   HGB 15.5*  < > 14.0 14.2 13.2 13.9 12.5  HCT 44.6  < > 41.4 42.2 40.1 41.0 37.6  MCV 104.7*  < > 106.2* 107.1* 105.8* 105.1* 105.0*  PLT 91*  < > 74* 76* 61* 64* 51*  < > = values in this interval not displayed. Cardiac Enzymes:  Recent Labs Lab 05/05/17 1256  TROPONINI 0.04*   BNP: Invalid input(s): POCBNP CBG:  Recent Labs Lab 05/11/17 0806 05/11/17 1203 05/11/17 1641 05/11/17 2129 05/12/17 0802  GLUCAP 79 148* 130* 104* 96   D-Dimer No results for input(s): DDIMER in the last 72 hours. Hgb A1c No results for input(s): HGBA1C in the last 72 hours. Lipid Profile No results for input(s): CHOL, HDL, LDLCALC, TRIG, CHOLHDL, LDLDIRECT in the last 72 hours. Thyroid function studies No results for input(s): TSH, T4TOTAL, T3FREE, THYROIDAB in the last 72 hours.  Invalid input(s): FREET3 Anemia work up No results for input(s): VITAMINB12, FOLATE, FERRITIN, TIBC, IRON, RETICCTPCT in the last 72 hours. Urinalysis    Component Value Date/Time   COLORURINE YELLOW 05/06/2017 [place] 05/06/2017 1206   LABSPEC 1.017 05/06/2017 1206   PHURINE 7.0 05/06/2017 1206   GLUCOSEU NEGATIVE 05/06/2017 [employer] 05/06/2017 [place] 05/06/2017 [place] 05/06/2017 [place] 05/06/2017 1206   NITRITE NEGATIVE 05/06/2017 1206    LEUKOCYTESUR SMALL (A) 05/06/2017 1206   Sepsis Labs Invalid input(s): PROCALCITONIN,  WBC,  LACTICIDVEN Microbiology Recent Results (from the past 240 hour(s))  Culture, body fluid-bottle  Status: None (Preliminary result)   Collection Time: 05/08/17 11:49 AM  Result Value Ref Range Status   Specimen Description FLUID PERITONEAL  Final   Special Requests NONE  Final   Culture   Final    NO GROWTH 4 DAYS Performed at [facility], [address], [place], [place] [address]    Report Status PENDING  Incomplete  Gram stain     Status: None   Collection Time: 05/08/17 11:49 AM  Result Value Ref Range Status   Specimen Description FLUID PERITONEAL  Final   Special Requests NONE  Final   Gram Stain   Final    FEW WBC PRESENT,BOTH PMN AND MONONUCLEAR NO ORGANISMS SEEN Performed at [facility], [address], [place], [place] [address]    Report Status 05/08/2017 FINAL  Final     Time coordinating discharge: Over 30 minutes  SIGNED:   Louellen Molder, MD  [facility] 05/12/2017, 11:02 AM Pager   If 7PM-7AM, please contact night-coverage [email or web address] Password [email or web address]

## 2017-05-12 NOTE — Discharge Instructions (Signed)
Cirrhosis Cirrhosis is long-term (chronic) liver injury. The liver is your largest internal organ, and it performs many functions. The liver converts food into energy, removes toxic material from your blood, makes important proteins, and absorbs necessary vitamins from your diet. If you have cirrhosis, it means many of your healthy liver cells have been replaced by scar tissue. This prevents blood from flowing through your liver, which makes it difficult for your liver to function. This scarring is not reversible, but treatment can prevent it from getting worse. What are the causes? Hepatitis C and long-term alcohol abuse are the most common causes of cirrhosis. Other causes include:  Nonalcoholic fatty liver disease.  Hepatitis B infection.  Autoimmune hepatitis.  Diseases that cause blockage of ducts inside the liver.  Inherited liver diseases.  Reactions to certain long-term medicines.  Parasitic infections.  Long-term exposure to certain toxins.  What increases the risk? You may have a higher risk of cirrhosis if you:  Have certain hepatitis viruses.  Abuse alcohol, especially if you are female.  Are overweight.  Share needles.  Have unprotected sex with someone who has hepatitis.  What are the signs or symptoms? You may not have any signs and symptoms at first. Symptoms may not develop until the damage to your liver starts to get worse. Signs and symptoms of cirrhosis may include:  Tenderness in the right-upper part of your abdomen.  Weakness and tiredness (fatigue).  Loss of appetite.  Nausea.  Weight loss and muscle loss.  Itchiness.  Yellow skin and eyes (jaundice).  Buildup of fluid in the abdomen (ascites).  Swelling of the feet and ankles (edema).  Appearance of tiny blood vessels under the skin.  Mental confusion.  Easy bruising and bleeding.  How is this diagnosed? Your health care provider may suspect cirrhosis based on your symptoms and  medical history, especially if you have other medical conditions or a history of alcohol abuse. Your health care provider will do a physical exam to feel your liver and check for signs of cirrhosis. Your health care provider may perform other tests, including:  Blood tests to check: ? Whether you have hepatitis B or C. ? Kidney function. ? Liver function.  Imaging tests such as: ? MRI or CT scan to look for changes seen in advanced cirrhosis. ? Ultrasound to see if normal liver tissue is being replaced by scar tissue.  A procedure using a long needle to take a sample of liver tissue (biopsy) for examination under a microscope. Liver biopsy can confirm the diagnosis of cirrhosis.  How is this treated? Treatment depends on how damaged your liver is and what caused the damage. Treatment may include treating cirrhosis symptoms or treating the underlying causes of the condition to try to slow the progression of the damage. Treatment may include:  Making lifestyle changes, such as: ? Eating a healthy diet. ? Restricting salt intake. ? Maintaining a healthy weight. ? Not abusing drugs or alcohol.  Taking medicines to: ? Treat liver infections or other infections. ? Control itching. ? Reduce fluid buildup. ? Reduce certain blood toxins. ? Reduce risk of bleeding from enlarged blood vessels in the stomach or esophagus (varices).  If varices are causing bleeding problems, you may need treatment with a procedure that ties up the vessels causing them to fall off (band ligation).  If cirrhosis is causing your liver to fail, your health care provider may recommend a liver transplant.  Other treatments may be recommended depending on any complications   of cirrhosis, such as liver-related kidney failure (hepatorenal syndrome).  Follow these instructions at home:  Take medicines only as directed by your health care provider. Do not use drugs that are toxic to your liver. Ask your health care  provider before taking any new medicines, including over-the-counter medicines.  Rest as needed.  Eat a well-balanced diet. Ask your health care provider or dietitian for more information.  You may have to follow a low-salt diet or restrict your water intake as directed.  Do not drink alcohol. This is especially important if you are taking acetaminophen.  Keep all follow-up visits as directed by your health care provider. This is important. Contact a health care provider if:  You have fatigue or weakness that is getting worse.  You develop swelling of the hands, feet, legs, or face.  You have a fever.  You develop loss of appetite.  You have nausea or vomiting.  You develop jaundice.  You develop easy bruising or bleeding. Get help right away if:  You vomit bright red blood or a material that looks like coffee grounds.  You have blood in your stools.  Your stools appear black and tarry.  You become confused.  You have chest pain or trouble breathing. This information is not intended to replace advice given to you by your health care provider. Make sure you discuss any questions you have with your health care provider. Document Released: 08/23/2005 Document Revised: 01/01/2016 Document Reviewed: 05/01/2014 Elsevier Interactive Patient Education  2018 [employer]  

## 2017-05-12 NOTE — Progress Notes (Signed)
Called SNF and gave report. Left contact # with Danae Chen, RN

## 2017-05-13 DIAGNOSIS — R5381 Other malaise: Secondary | ICD-10-CM | POA: Diagnosis not present

## 2017-05-13 DIAGNOSIS — J9601 Acute respiratory failure with hypoxia: Secondary | ICD-10-CM | POA: Diagnosis not present

## 2017-05-13 DIAGNOSIS — K7469 Other cirrhosis of liver: Secondary | ICD-10-CM | POA: Diagnosis not present

## 2017-05-13 DIAGNOSIS — I5031 Acute diastolic (congestive) heart failure: Secondary | ICD-10-CM | POA: Diagnosis not present

## 2017-05-13 LAB — CULTURE, BODY FLUID-BOTTLE: CULTURE: NO GROWTH

## 2017-05-13 LAB — ANTI-SMOOTH MUSCLE ANTIBODY, IGG: F-Actin IgG: 25 Units — ABNORMAL HIGH (ref 0–19)

## 2017-05-13 LAB — CULTURE, BODY FLUID W GRAM STAIN -BOTTLE

## 2017-05-13 LAB — HEPATITIS A ANTIBODY, TOTAL: Hep A Total Ab: NEGATIVE

## 2017-05-13 LAB — ANTINUCLEAR ANTIBODIES, IFA: ANA Ab, IFA: NEGATIVE

## 2017-05-13 LAB — HEPATITIS B SURFACE ANTIBODY, QUANTITATIVE: HEPATITIS B-POST: 43.5 m[IU]/mL

## 2017-05-13 LAB — IGG: IGG (IMMUNOGLOBIN G), SERUM: 1275 mg/dL (ref 700–1600)

## 2017-05-13 LAB — HEPATITIS B SURFACE ANTIGEN: HEP B S AG: NEGATIVE

## 2017-05-13 LAB — HEPATITIS B CORE ANTIBODY, IGM: Hep B C IgM: NEGATIVE

## 2017-05-13 LAB — CERULOPLASMIN: Ceruloplasmin: 24.8 mg/dL (ref 19.0–39.0)

## 2017-05-13 LAB — AFP TUMOR MARKER: AFP, SERUM, TUMOR MARKER: 4.6 ng/mL (ref 0.0–8.3)

## 2017-05-13 LAB — MITOCHONDRIAL ANTIBODIES: MITOCHONDRIAL M2 AB, IGG: 6.4 U (ref 0.0–20.0)

## 2017-05-13 LAB — HEPATITIS C ANTIBODY: HCV AB: 0.2 {s_co_ratio} (ref 0.0–0.9)

## 2017-05-17 LAB — ANTI-MICROSOMAL ANTIBODY LIVER / KIDNEY: LKM1 AB: 8.3 U (ref 0.0–20.0)

## 2017-05-23 DIAGNOSIS — I5189 Other ill-defined heart diseases: Secondary | ICD-10-CM | POA: Diagnosis not present

## 2017-05-23 DIAGNOSIS — R008 Other abnormalities of heart beat: Secondary | ICD-10-CM | POA: Diagnosis not present

## 2017-05-27 DIAGNOSIS — E119 Type 2 diabetes mellitus without complications: Secondary | ICD-10-CM | POA: Diagnosis not present

## 2017-05-27 DIAGNOSIS — K7469 Other cirrhosis of liver: Secondary | ICD-10-CM | POA: Diagnosis not present

## 2017-05-27 DIAGNOSIS — I5189 Other ill-defined heart diseases: Secondary | ICD-10-CM | POA: Diagnosis not present

## 2017-06-01 DIAGNOSIS — R531 Weakness: Secondary | ICD-10-CM | POA: Diagnosis not present

## 2017-06-01 DIAGNOSIS — J9621 Acute and chronic respiratory failure with hypoxia: Secondary | ICD-10-CM | POA: Diagnosis not present

## 2017-06-01 DIAGNOSIS — I5033 Acute on chronic diastolic (congestive) heart failure: Secondary | ICD-10-CM | POA: Diagnosis not present

## 2017-06-01 DIAGNOSIS — J9601 Acute respiratory failure with hypoxia: Secondary | ICD-10-CM | POA: Diagnosis not present

## 2017-06-02 ENCOUNTER — Other Ambulatory Visit: Payer: Self-pay | Admitting: *Deleted

## 2017-06-02 ENCOUNTER — Encounter: Payer: Self-pay | Admitting: *Deleted

## 2017-06-02 DIAGNOSIS — E785 Hyperlipidemia, unspecified: Secondary | ICD-10-CM | POA: Diagnosis not present

## 2017-06-02 DIAGNOSIS — K7460 Unspecified cirrhosis of liver: Secondary | ICD-10-CM | POA: Diagnosis not present

## 2017-06-02 DIAGNOSIS — K7581 Nonalcoholic steatohepatitis (NASH): Secondary | ICD-10-CM | POA: Diagnosis not present

## 2017-06-02 DIAGNOSIS — E119 Type 2 diabetes mellitus without complications: Secondary | ICD-10-CM | POA: Diagnosis not present

## 2017-06-02 DIAGNOSIS — I5032 Chronic diastolic (congestive) heart failure: Secondary | ICD-10-CM | POA: Diagnosis not present

## 2017-06-02 DIAGNOSIS — I110 Hypertensive heart disease with heart failure: Secondary | ICD-10-CM | POA: Diagnosis not present

## 2017-06-02 DIAGNOSIS — J9611 Chronic respiratory failure with hypoxia: Secondary | ICD-10-CM | POA: Diagnosis not present

## 2017-06-02 DIAGNOSIS — D696 Thrombocytopenia, unspecified: Secondary | ICD-10-CM | POA: Diagnosis not present

## 2017-06-02 DIAGNOSIS — E039 Hypothyroidism, unspecified: Secondary | ICD-10-CM | POA: Diagnosis not present

## 2017-06-02 DIAGNOSIS — J449 Chronic obstructive pulmonary disease, unspecified: Secondary | ICD-10-CM | POA: Diagnosis not present

## 2017-06-02 DIAGNOSIS — F339 Major depressive disorder, recurrent, unspecified: Secondary | ICD-10-CM | POA: Diagnosis not present

## 2017-06-02 NOTE — Patient Outreach (Signed)
[facility]) Care Management  06/02/2017  Halena Mohar Coye 07-29-1941 [id number]   Transition of Care Referral  Referral Date: 06/01/17 Referral Source: Urgent TOC Date of Discharge: 05/29/17 Facility: [facility] Discharge Diagnosis: Acute on chronic diastolic heart failure Insurance: HTA  Outreach attempt to patient. HIPPA identifiers verified with patient. Patient was unable to answer screening questions. Patient and her daughter requested for RN CM to speak with patient's spouse [name]). [name] explained patient's medical condition in great detail. He stated, patient's health has declined since being discharged from [facility]. [name] reported, patient has fallen after being discharged from rehab without injury. Patient is weak, has a poor appetite, dizzy, and mild confusion, per [name]. [name] reported, he stopped given the patient her blood pressure medication due to low blood pressure readings and dizziness. [name] stated, the patient hands were shaking. RN CM asked, if daughter was able to check patient's blood glucose. According to [name], patient's last blood glucose on 06/01/17 was 107. He stated, patient blood pressure was not checked today (06/02/17). [name] explained "how patient is being treated for heart failure, which the doctors said she doesn't have". [name] believes most of patient's problems are caused by her liver. Patient had a Gastroenterologist appointment scheduled. [name] stated, he canceled the appointment due to patient's health. He reported making a primary MD appointment scheduled for 06/03/17. During our phone conversation, [name] stated, the Physical Therapist from [facility] was present and waiting to talk with him. [name] stated, the co-pay for the Physical Therapist will be $25 per visit. He stated, he could afford PT for a few weeks. [name] explained, initially he appealed patient's discharge from [facility]. He  stated, he couldn't afford to pay $150 per day. He reorted, he canceled the appeal and patient was discharged home.  Spouse stated, "He may have a heart attack worrying about his wife's health". [facility] benefits and services explained to spouse. [name] stated, he would greatly appreciate all the assistance his wife qualifies for.  . Plan: RN CM will send referral to [facility] RN for further in home eval/assessment of care needs and management of chronic conditions. RN CM will send referral to [place] for transition of care. RN CM advised patient that [facility] community RN CM would follow up and contact patient within the next 10 days. RN CM will send [facility] SW referral for possible assistance with community resources related to possible long term rehab, personal care services, and safety evaluation.    Lake Bells, RN, BSN, MHA/MSL, [place] Telephonic Care Manager Coordinator [facility] Direct Phone: [phone] Toll Free: [phone] Fax: [phone]

## 2017-06-03 ENCOUNTER — Encounter: Payer: Self-pay | Admitting: *Deleted

## 2017-06-03 ENCOUNTER — Other Ambulatory Visit: Payer: Self-pay | Admitting: *Deleted

## 2017-06-03 DIAGNOSIS — I10 Essential (primary) hypertension: Secondary | ICD-10-CM | POA: Diagnosis not present

## 2017-06-03 DIAGNOSIS — Z23 Encounter for immunization: Secondary | ICD-10-CM | POA: Diagnosis not present

## 2017-06-03 DIAGNOSIS — K7469 Other cirrhosis of liver: Secondary | ICD-10-CM | POA: Diagnosis not present

## 2017-06-03 DIAGNOSIS — E119 Type 2 diabetes mellitus without complications: Secondary | ICD-10-CM | POA: Diagnosis not present

## 2017-06-03 NOTE — Patient Outreach (Addendum)
[place] [facility]) Care Management  06/03/2017  Bayle Calvo Bohne 02-03-1941 [id number]   RN received a referral 9/27   RN attempted outreach call today however unsuccessful and unable to leave a message. Will continue outreach calls and schedule accordingly for pending [facility] services.   [name], RN Care Management Coordinator [place] Office [phone]

## 2017-06-03 NOTE — Patient Outreach (Signed)
[facility]) Care Management  06/03/2017  Brittany Reid April 04, 1941 [id number]  Referral received 9/27 RN initial outreach attempt unsuccessful and RN unable to leave a HIPAA approved voice message. Will rescheduled ongoing transition of care calls.  [name], RN Care Management Coordinator [place] Office [phone]

## 2017-06-03 NOTE — Patient Outreach (Signed)
[place] [facility]) Care Management  06/03/2017  Brittany Reid 1940-11-24 [id number]   CSW made an initial attempt to try and contact patient today to perform phone assessment, as well as assess and assist with social needs and services, without success.  A HIPAA compliant message was left for patient on voicemail.  CSW is currently awaiting a return call. CSW will make a second outreach attempt within the next week, if CSW does not receive a return call from patient in the meantime. Nat Christen, BSW, MSW, LCSW  Licensed Education officer, environmental Health System  Mailing [place] [address] [place], [place] [address] Physical Address-[address], [place], [place] [address] Toll Free Main # [phone] Fax # [phone] Cell # [phone]  Office # [phone] [email or web address]

## 2017-06-05 DIAGNOSIS — Z832 Family history of diseases of the blood and blood-forming organs and certain disorders involving the immune mechanism: Secondary | ICD-10-CM | POA: Diagnosis not present

## 2017-06-05 DIAGNOSIS — I119 Hypertensive heart disease without heart failure: Secondary | ICD-10-CM | POA: Diagnosis not present

## 2017-06-05 DIAGNOSIS — K5900 Constipation, unspecified: Secondary | ICD-10-CM | POA: Diagnosis not present

## 2017-06-05 DIAGNOSIS — K7460 Unspecified cirrhosis of liver: Secondary | ICD-10-CM | POA: Diagnosis not present

## 2017-06-05 DIAGNOSIS — R600 Localized edema: Secondary | ICD-10-CM | POA: Diagnosis not present

## 2017-06-05 DIAGNOSIS — E871 Hypo-osmolality and hyponatremia: Secondary | ICD-10-CM | POA: Diagnosis not present

## 2017-06-05 DIAGNOSIS — E875 Hyperkalemia: Secondary | ICD-10-CM | POA: Diagnosis not present

## 2017-06-05 DIAGNOSIS — E119 Type 2 diabetes mellitus without complications: Secondary | ICD-10-CM | POA: Diagnosis not present

## 2017-06-05 DIAGNOSIS — K7581 Nonalcoholic steatohepatitis (NASH): Secondary | ICD-10-CM | POA: Diagnosis not present

## 2017-06-05 DIAGNOSIS — Z825 Family history of asthma and other chronic lower respiratory diseases: Secondary | ICD-10-CM | POA: Diagnosis not present

## 2017-06-05 DIAGNOSIS — R404 Transient alteration of awareness: Secondary | ICD-10-CM | POA: Diagnosis not present

## 2017-06-05 DIAGNOSIS — N179 Acute kidney failure, unspecified: Secondary | ICD-10-CM | POA: Diagnosis not present

## 2017-06-05 DIAGNOSIS — R5381 Other malaise: Secondary | ICD-10-CM | POA: Diagnosis not present

## 2017-06-05 DIAGNOSIS — I110 Hypertensive heart disease with heart failure: Secondary | ICD-10-CM | POA: Diagnosis not present

## 2017-06-05 DIAGNOSIS — G4733 Obstructive sleep apnea (adult) (pediatric): Secondary | ICD-10-CM | POA: Diagnosis not present

## 2017-06-05 DIAGNOSIS — D696 Thrombocytopenia, unspecified: Secondary | ICD-10-CM | POA: Diagnosis not present

## 2017-06-05 DIAGNOSIS — Z87891 Personal history of nicotine dependence: Secondary | ICD-10-CM | POA: Diagnosis not present

## 2017-06-05 DIAGNOSIS — R531 Weakness: Secondary | ICD-10-CM | POA: Diagnosis not present

## 2017-06-05 DIAGNOSIS — E861 Hypovolemia: Secondary | ICD-10-CM | POA: Diagnosis not present

## 2017-06-05 DIAGNOSIS — K7290 Hepatic failure, unspecified without coma: Secondary | ICD-10-CM | POA: Diagnosis not present

## 2017-06-05 DIAGNOSIS — Z79899 Other long term (current) drug therapy: Secondary | ICD-10-CM | POA: Diagnosis not present

## 2017-06-05 DIAGNOSIS — R05 Cough: Secondary | ICD-10-CM | POA: Diagnosis not present

## 2017-06-05 DIAGNOSIS — I5032 Chronic diastolic (congestive) heart failure: Secondary | ICD-10-CM | POA: Diagnosis not present

## 2017-06-05 DIAGNOSIS — R9431 Abnormal electrocardiogram [ECG] [EKG]: Secondary | ICD-10-CM | POA: Diagnosis not present

## 2017-06-05 DIAGNOSIS — Z809 Family history of malignant neoplasm, unspecified: Secondary | ICD-10-CM | POA: Diagnosis not present

## 2017-06-05 DIAGNOSIS — E785 Hyperlipidemia, unspecified: Secondary | ICD-10-CM | POA: Diagnosis not present

## 2017-06-05 DIAGNOSIS — R188 Other ascites: Secondary | ICD-10-CM | POA: Diagnosis not present

## 2017-06-06 ENCOUNTER — Other Ambulatory Visit: Payer: Self-pay | Admitting: *Deleted

## 2017-06-06 ENCOUNTER — Encounter: Payer: Self-pay | Admitting: *Deleted

## 2017-06-06 NOTE — Patient Outreach (Signed)
[place] [facility]) Care Management  06/06/2017  Brittany Reid 08/29/1941 [id number]   CSW was able to make brief contact with patient's husband, [name] Regino today.  Mr. Zweig was unable to talk for long, reporting that he was "headed up to the hospital to see wife".  Mr. Idler went on to say that he ended up having to take patient back to the hospital yesterday, due to weakness and fatigue.  Patient currently resides at [facility].  Mr. Frate admits that he was unable to care for patient in the condition she was in, wanting patient to get additional rehabilitative services, both physical and occupational therapies.  CSW explained to Mr. Likins that patient's CSW, Eduard Clos will be back in contact with them within the next week, upon her return from leave.  Mr. Cashwell voiced understanding and was agreeable to this plan. Nat Christen, BSW, MSW, LCSW  Licensed Education officer, environmental Health System  Mailing [place] [address] [place], [place] [address] Physical Address-[address], [place], [place] [address] Toll Free Main # [phone] Fax # [phone] Cell # [phone]  Office # [phone] [email or web address]

## 2017-06-06 NOTE — Patient Outreach (Signed)
[facility]) Care Management  06/06/2017  Brittany Reid 1940/09/28 [id number]   RN second outreach attempt to reach this pt however remains unsuccessful. RN able to leave a HIPAA approved voice message requesting a call back. Will continue to reach this pt and scheduled another call over the next week.  [name], RN Care Management Coordinator [place] Office [phone]

## 2017-06-07 ENCOUNTER — Other Ambulatory Visit: Payer: Self-pay | Admitting: *Deleted

## 2017-06-07 NOTE — Patient Outreach (Signed)
[place] [facility]) Care Management  06/07/2017  Brittany Reid 30-Jun-1941 [id number]   RN received a call back from the pt's spouse [name]) who verified pt's identifiers and states pt remains in [facility] for ongoing care. Spouse had several question however related to the discharge services that are needed in the home once pt returns home. RN strongly encouraged spouse to inquire further with the discharge planning personnel at the hospital prior to pt's discharge home. RN explained the [facility] services and available program and the purpose of contact the pt earlier when she was readmitted earlier in the week. RN explained to caregiver that [facility] services will outreach to the pt once again when she is discharged from the hospital if ongoing case management needs are needed. RN also explained the difference between [facility] and case management services. Spouse with an understanding and RN will follow up once pt returns home with another referral from the [place]. Will alert CMA that pt has been readmitted to a non-[facility] facility.  [name], RN Care Management Coordinator [place] Office [phone]

## 2017-06-09 ENCOUNTER — Ambulatory Visit: Payer: Self-pay | Admitting: *Deleted

## 2017-06-10 DIAGNOSIS — I110 Hypertensive heart disease with heart failure: Secondary | ICD-10-CM | POA: Diagnosis not present

## 2017-06-10 DIAGNOSIS — I5032 Chronic diastolic (congestive) heart failure: Secondary | ICD-10-CM | POA: Diagnosis not present

## 2017-06-10 DIAGNOSIS — J449 Chronic obstructive pulmonary disease, unspecified: Secondary | ICD-10-CM | POA: Diagnosis not present

## 2017-06-10 DIAGNOSIS — J9611 Chronic respiratory failure with hypoxia: Secondary | ICD-10-CM | POA: Diagnosis not present

## 2017-06-10 DIAGNOSIS — E119 Type 2 diabetes mellitus without complications: Secondary | ICD-10-CM | POA: Diagnosis not present

## 2017-06-13 ENCOUNTER — Other Ambulatory Visit: Payer: Self-pay | Admitting: *Deleted

## 2017-06-13 NOTE — Patient Outreach (Addendum)
[facility]) Care Management  06/13/2017  Brittany Reid Jan 09, 1941 [id number]   Initial Transition of care from d/c 10/4. Prior to pt was active with Transition of care outreach attempts but unsuccessful follow by a d/c 9/6. Pt was readmitted into a non-[facility] facility and discharged again 10/4. RN had spoken with the pt's spouse during her hospitalization has he was returning a call to this RN.  RN received a call from caregiver who left a voice message that pt recent discharged from the hospital. RN returned the call and spoke with the pt who provided verbal consent to speak with her spouse concerning her needs. RN obtained identifiers and expressed once again [facility] services and available programs. Spouse very confused on the type of services needed and inquired on again what he was told about receiving 35 hours of nursing from [facility]. RN informed spouse once again to contact his insurance company to inquire on such services but informed pt that [facility] does not provide the requested services for nursing in the home. RN further inquired on pt's needs at this time and attempted to obtain information on transition of care. Spouse indicates he did not received any discharge orders or paperwork to review with this RN but confirm some information related. States several issues with pt and not sure of what to do however states pt has a hospital follow up appointment with her provider on Wednesday this week. Issues noted below  NUTRITION: Spouse states pt not eating well but has not los any weight -RN encouraged pt to discussed with pt's provider for possible dietary consult. FLUID RETENTION: Spouse states pt maybe gaining fluid-RN inquired on any swelling (none) and verified pt has not gained any weight and breathing good with no distress. Spouse reports vitals with sats at 94% and HR at 99.  WOUND: Spouse reports hisotry of wound under the breat that was resolved but maybe present once again.  States Dr. Sallyanne Havers has referred pt to a dermatologist who provided medication t one time but not sure of what to do now-RN encouraged spouse to have pt's primary provider view the site once again and inquired on possible intervention for treatment once again using the same medication or a possible consult to the pt's dermatologist. LABS: Spouse concerning about pt's pneumonia levels and inquired about possible labs- RN encouraged spouse to discussed once again on the office visit with Dr. Sallyanne Havers and labs maybe drawn at that time or ordered via [facility] however if he did not wish for [facility] to be involved as spouse has indicated he did not wish to have nursing intervention in the home. RN strongly encouraged spouse to informed the provider's office of his decision so that labs can be drawn at that time.  MEDICATIONS: Spouse states no medications have changed however did not wish to review medications at this time due to the pending upcoming appointments may change-RN will attempt to review on Thursday after visit with the pt's primary provider.  RN has offered community home visits and ongoing telephonic transition of care calls over the new few weeks however spouse did not accept this invitation at this time due to the pending provider's office visit and confirmation from his health insurance on the nurse services he is expecting for the 35 hours a week. RN has informed the caregiver that [facility] does not interfere with any other services in the home and there is no charge for this RN to visit and service this pt at this  time (spouse with a clear understanding but did not agree with visits at this time). RN offered to follow up with another transition of care call on Thursday after this visit with the primary provider as spouse agree and was very appreciative.   Summary spouse has not agreed to [facility] services for CM services or home visits at this time but received to a follow up call on Thursday after he visits  the pt's primary provider. RN will follow up accordingly and did not generate a plan of care due to pt/spouse have not agreed to [facility] services based upon this last discharged until they see pt's primary provider on Wednesday. Will readdress this on the follow up call. Due to the confusion and frustration of the caregiver RN contacted the primary provider's office Dr. Sallyanne Havers and spoke with a representative and explained the above issues with this pt and [facility] disposition at this time. Strongly encouraged office to address the above needs in case the spouse was not able to remember all that was discussed via telephonically today with this RN case manager. Also informed the office if there is something that [facility] needs to address further to please discuss with pt and contact this RN directly leaving my number and name with this representative. No further inquires or request at this time. RN will follow up with the pt later in this week as disgusted.  Brittany Mina, RN Care Management Coordinator [place] Office [phone]

## 2017-06-15 DIAGNOSIS — K7460 Unspecified cirrhosis of liver: Secondary | ICD-10-CM | POA: Diagnosis not present

## 2017-06-15 DIAGNOSIS — I5032 Chronic diastolic (congestive) heart failure: Secondary | ICD-10-CM | POA: Diagnosis not present

## 2017-06-15 DIAGNOSIS — I110 Hypertensive heart disease with heart failure: Secondary | ICD-10-CM | POA: Diagnosis not present

## 2017-06-16 ENCOUNTER — Encounter: Payer: Self-pay | Admitting: *Deleted

## 2017-06-16 ENCOUNTER — Other Ambulatory Visit: Payer: Self-pay | Admitting: *Deleted

## 2017-06-16 NOTE — Patient Outreach (Addendum)
[place] [facility]) Care Management  06/16/2017  Katelyn Broadnax Beshears Nov 03, 1940 [id number]  [place] DECLINED FOR CASE MANAGEMENT (NURSING)  RN followed up with caregiver spouse concerning [facility] services after pt's recent provider's visit. Caregiver states pt's provider will attempt to prescribed an appetite enhancer once pt's labs are completed. Caregiver has spoke with his insurance company and aware of all his co-pays for services with [employer]. RN discussed once again [facility] services and offered any assistance at this time including any needed resources. Caregiver states he is able to perform most of the pt's care including her ADL the only issue now is pt's lack of appetite currently pending intervention with the pt's provider. Caregiver states he is able to care for his wife as far as daily care with baths, vitals with home device, home O2 and use of DME both inside and outside the home. Caregiver states he is able to administer pt's daily medications and attends all scheduled medical appointments. RN further discussed [facility] service for potential needs and possible plan of care with goals for [facility] services to continue however caregiver feels he is able to manage pt's care and does not need nursing services at this time. RN offered community resources and ongoing transition of care contacts however caregiver declined this services also indicating he would request [facility] services through his insurance company ([employer]) if [facility] is needed in the further. Case will be close for this discipline and an update will be provided to the involved disciplines accordingly.  [name], RN Care Management Coordinator [place] Office [phone]

## 2017-06-17 ENCOUNTER — Other Ambulatory Visit: Payer: Self-pay | Admitting: *Deleted

## 2017-06-17 DIAGNOSIS — I5032 Chronic diastolic (congestive) heart failure: Secondary | ICD-10-CM | POA: Diagnosis not present

## 2017-06-17 DIAGNOSIS — I110 Hypertensive heart disease with heart failure: Secondary | ICD-10-CM | POA: Diagnosis not present

## 2017-06-17 NOTE — Patient Outreach (Signed)
[place] [facility]) Care Management  06/17/2017  Brittany Reid [birth year] [id number]   CSW assisting with [facility] CSW, Marcie Bal - CSW had received referral for assistance with community resources. CSW called & spoke with patient & daughter as patient's husband was out running errands. Patient's wife & daughter confirmed that no assistance is needed at this time and that a nurse with [facility] had called earlier to offer services and patient's husband had declined then as well. Patient's daughter informed CSW that patient's husband is able to tend to her ADL needs - bathing, dressing, etc. Patient currently has a Physical Therapist through [facility] involved in her care & patient's husband stated to [place] that he will reach out to [facility] [employer] if they have any needs in the future, but at this time they do not want any outside help or resources.   CSW will perform a case closure on patient as no social work needs have been identified at this time. CSW will send an update to patient's Primary Care Physician, Dr. Drake Leach to ensure that they are aware of [facility] involvement with patient's plan of care. CSW will submit a case closure request to Verlon Setting, Care Management Assistant with [place] in the form of an inbasket message.    Raynaldo Opitz, LCSW [facility]  Clinical Social Worker cell #: [phone]

## 2017-06-24 DIAGNOSIS — R5383 Other fatigue: Secondary | ICD-10-CM | POA: Diagnosis not present

## 2017-06-24 DIAGNOSIS — K7581 Nonalcoholic steatohepatitis (NASH): Secondary | ICD-10-CM | POA: Diagnosis not present

## 2017-06-24 DIAGNOSIS — K7290 Hepatic failure, unspecified without coma: Secondary | ICD-10-CM | POA: Diagnosis not present

## 2017-06-24 DIAGNOSIS — K8020 Calculus of gallbladder without cholecystitis without obstruction: Secondary | ICD-10-CM | POA: Diagnosis not present

## 2017-06-24 DIAGNOSIS — R531 Weakness: Secondary | ICD-10-CM | POA: Diagnosis not present

## 2017-06-24 DIAGNOSIS — N289 Disorder of kidney and ureter, unspecified: Secondary | ICD-10-CM | POA: Diagnosis not present

## 2017-06-24 DIAGNOSIS — I5032 Chronic diastolic (congestive) heart failure: Secondary | ICD-10-CM | POA: Diagnosis not present

## 2017-06-24 DIAGNOSIS — R404 Transient alteration of awareness: Secondary | ICD-10-CM | POA: Diagnosis not present

## 2017-06-24 DIAGNOSIS — R188 Other ascites: Secondary | ICD-10-CM | POA: Diagnosis not present

## 2017-06-24 DIAGNOSIS — E440 Moderate protein-calorie malnutrition: Secondary | ICD-10-CM | POA: Diagnosis not present

## 2017-06-24 DIAGNOSIS — R7989 Other specified abnormal findings of blood chemistry: Secondary | ICD-10-CM | POA: Diagnosis not present

## 2017-06-24 DIAGNOSIS — E8809 Other disorders of plasma-protein metabolism, not elsewhere classified: Secondary | ICD-10-CM | POA: Diagnosis not present

## 2017-06-24 DIAGNOSIS — R791 Abnormal coagulation profile: Secondary | ICD-10-CM | POA: Diagnosis not present

## 2017-06-24 DIAGNOSIS — R42 Dizziness and giddiness: Secondary | ICD-10-CM | POA: Diagnosis not present

## 2017-06-24 DIAGNOSIS — R740 Nonspecific elevation of levels of transaminase and lactic acid dehydrogenase [LDH]: Secondary | ICD-10-CM | POA: Diagnosis not present

## 2017-06-24 DIAGNOSIS — R748 Abnormal levels of other serum enzymes: Secondary | ICD-10-CM | POA: Diagnosis not present

## 2017-06-24 DIAGNOSIS — R296 Repeated falls: Secondary | ICD-10-CM | POA: Diagnosis not present

## 2017-06-24 DIAGNOSIS — R9431 Abnormal electrocardiogram [ECG] [EKG]: Secondary | ICD-10-CM | POA: Diagnosis not present

## 2017-06-24 DIAGNOSIS — E7220 Disorder of urea cycle metabolism, unspecified: Secondary | ICD-10-CM | POA: Diagnosis not present

## 2017-06-24 DIAGNOSIS — K767 Hepatorenal syndrome: Secondary | ICD-10-CM | POA: Diagnosis not present

## 2017-06-24 DIAGNOSIS — N179 Acute kidney failure, unspecified: Secondary | ICD-10-CM | POA: Diagnosis not present

## 2017-06-24 DIAGNOSIS — K8080 Other cholelithiasis without obstruction: Secondary | ICD-10-CM | POA: Diagnosis not present

## 2017-06-24 DIAGNOSIS — I509 Heart failure, unspecified: Secondary | ICD-10-CM | POA: Diagnosis not present

## 2017-06-24 DIAGNOSIS — Z515 Encounter for palliative care: Secondary | ICD-10-CM | POA: Diagnosis not present

## 2017-06-24 DIAGNOSIS — K7210 Chronic hepatic failure without coma: Secondary | ICD-10-CM | POA: Diagnosis not present

## 2017-06-24 DIAGNOSIS — I110 Hypertensive heart disease with heart failure: Secondary | ICD-10-CM | POA: Diagnosis not present

## 2017-06-24 DIAGNOSIS — E871 Hypo-osmolality and hyponatremia: Secondary | ICD-10-CM | POA: Diagnosis not present

## 2017-06-24 DIAGNOSIS — K7460 Unspecified cirrhosis of liver: Secondary | ICD-10-CM | POA: Diagnosis not present

## 2017-06-24 DIAGNOSIS — R2230 Localized swelling, mass and lump, unspecified upper limb: Secondary | ICD-10-CM | POA: Diagnosis not present

## 2017-06-24 DIAGNOSIS — E1165 Type 2 diabetes mellitus with hyperglycemia: Secondary | ICD-10-CM | POA: Diagnosis not present

## 2017-06-24 DIAGNOSIS — K7469 Other cirrhosis of liver: Secondary | ICD-10-CM | POA: Diagnosis not present

## 2017-06-24 DIAGNOSIS — F5000 Anorexia nervosa, unspecified: Secondary | ICD-10-CM | POA: Diagnosis not present

## 2017-06-24 DIAGNOSIS — D696 Thrombocytopenia, unspecified: Secondary | ICD-10-CM | POA: Diagnosis not present

## 2017-06-24 DIAGNOSIS — I951 Orthostatic hypotension: Secondary | ICD-10-CM | POA: Diagnosis not present

## 2017-07-12 DIAGNOSIS — N186 End stage renal disease: Secondary | ICD-10-CM | POA: Diagnosis not present

## 2017-07-12 DIAGNOSIS — R188 Other ascites: Secondary | ICD-10-CM | POA: Diagnosis not present

## 2017-07-12 DIAGNOSIS — I5040 Unspecified combined systolic (congestive) and diastolic (congestive) heart failure: Secondary | ICD-10-CM | POA: Diagnosis not present

## 2017-07-12 DIAGNOSIS — K7581 Nonalcoholic steatohepatitis (NASH): Secondary | ICD-10-CM | POA: Diagnosis not present

## 2017-07-12 DIAGNOSIS — K7469 Other cirrhosis of liver: Secondary | ICD-10-CM | POA: Diagnosis not present

## 2017-07-12 DIAGNOSIS — E8770 Fluid overload, unspecified: Secondary | ICD-10-CM | POA: Diagnosis not present

## 2017-08-06 DEATH — deceased

## 2018-01-19 IMAGING — CR DG CHEST 2V
2 series · 2 of 2 positions shown · non-contrast
Comparison: 05/14/2016

CLINICAL DATA: Shortness of breath with exertion, BILATERAL leg
swelling for 2 weeks, former smoker, diabetes mellitus, hypertension

EXAM:
CHEST  2 VIEW

[w chest lat]
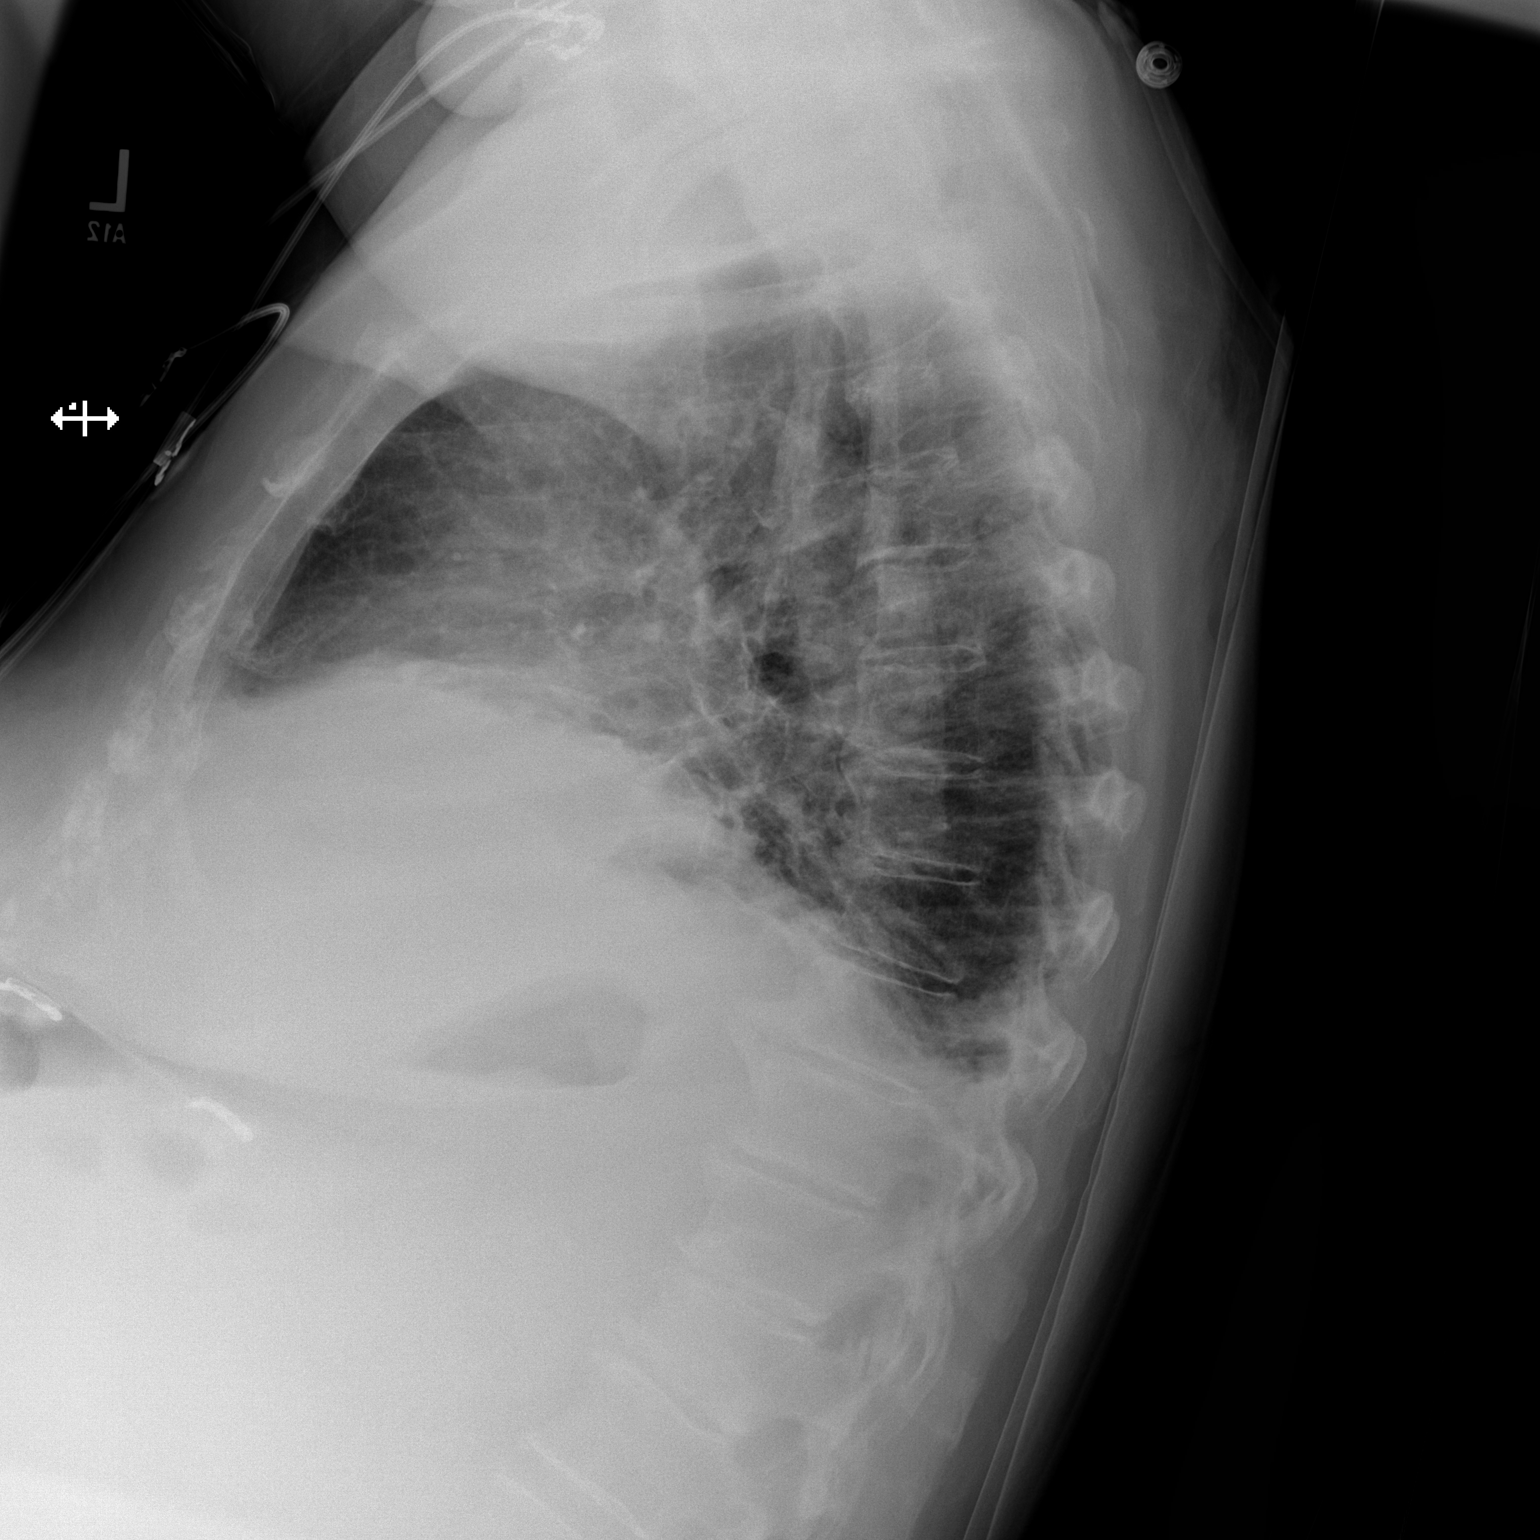

[x chest ap]
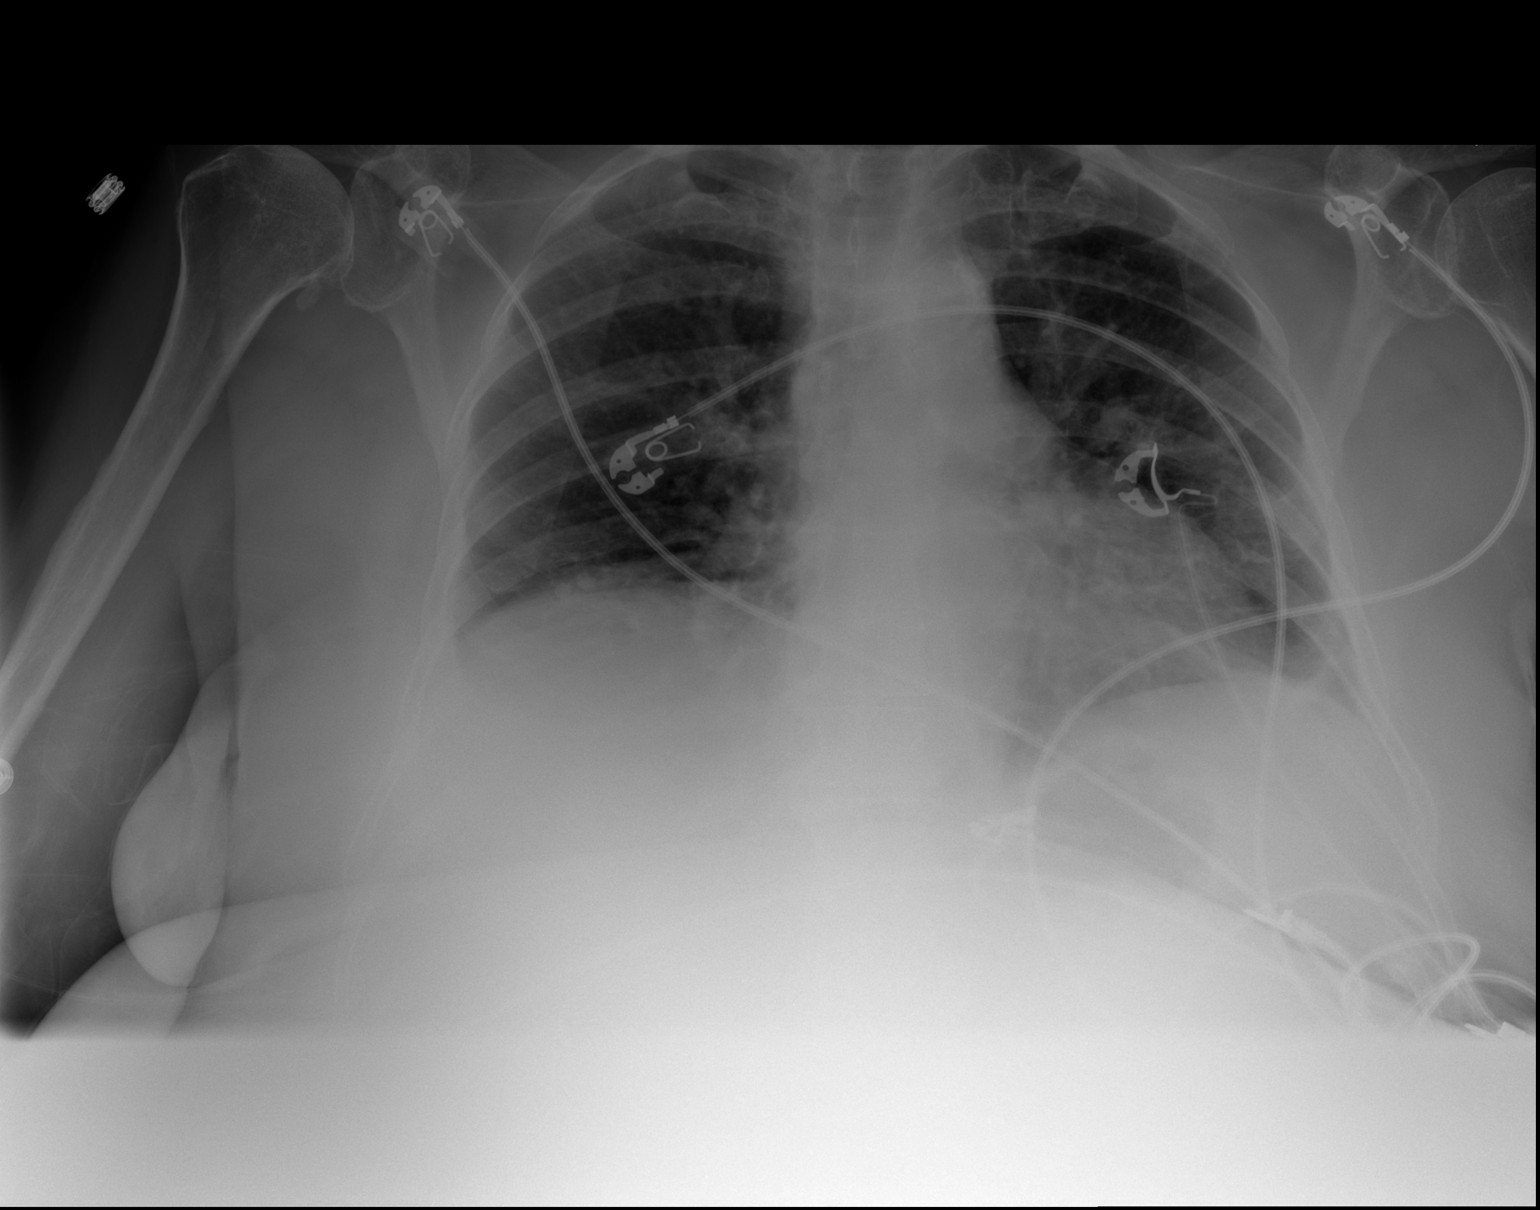

[2 of 2 positions shown; findings below may reference images not displayed]

FINDINGS: Upper normal heart size.

Mediastinal contours and pulmonary vascularity normal.

Bibasilar atelectasis with decreased lung volumes versus previous
study.

Atherosclerotic calcification aortic arch.

No definite infiltrate, pleural effusion or pneumothorax.

Bones demineralized with scattered degenerative disc disease changes
of the thoracic spine and BILATERAL AC joint degenerative changes.
IMPRESSION: Decreased lung volumes with bibasilar atelectasis.

## 2018-01-22 IMAGING — US US PARACENTESIS
1 series · 4 of 4 positions shown · non-contrast
Comparison: none

INDICATION: Patient with past medical history of CHF and cirrhosis, now with
ascites. Request is made for diagnostic and therapeutic paracentesis
of up to 1 liter.

[Series 1: us paracentesis · 0.28mm/px · 4 of 4 slices shown]
[im 1/4]
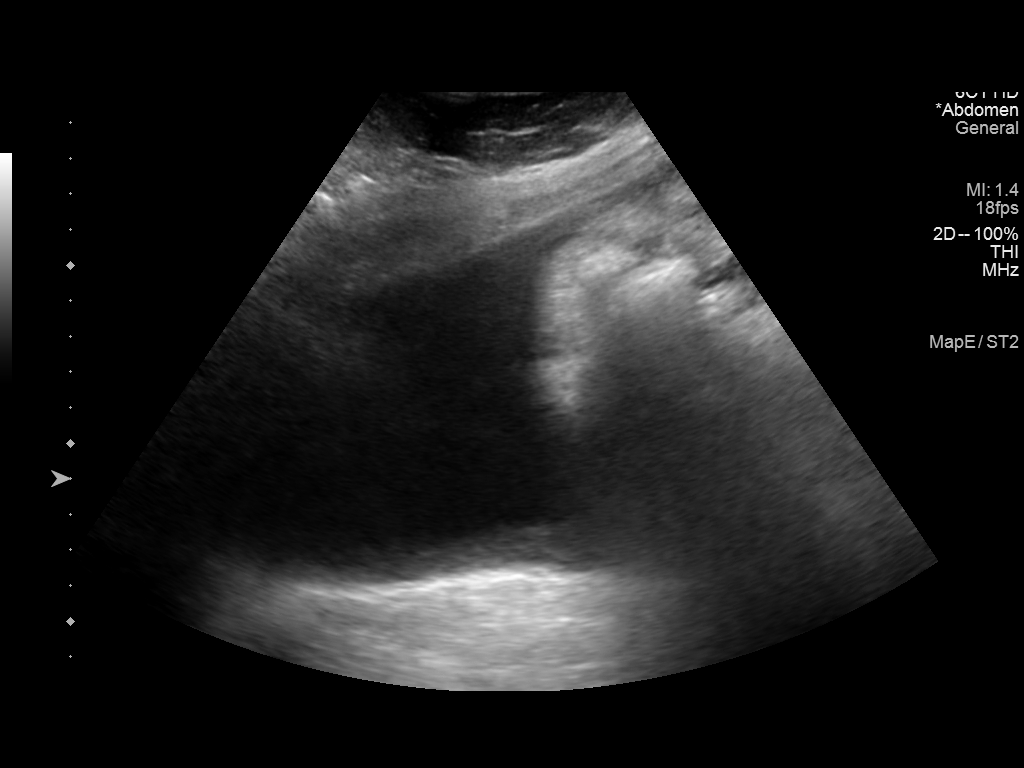
[im 2/4]
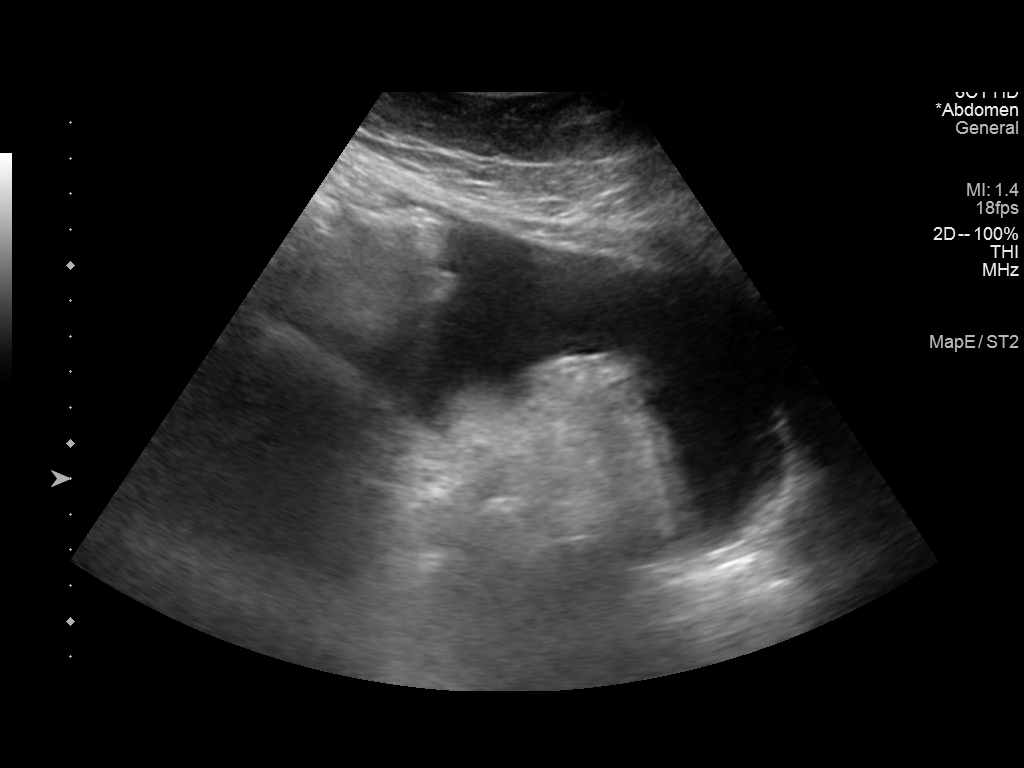
[im 3/4]
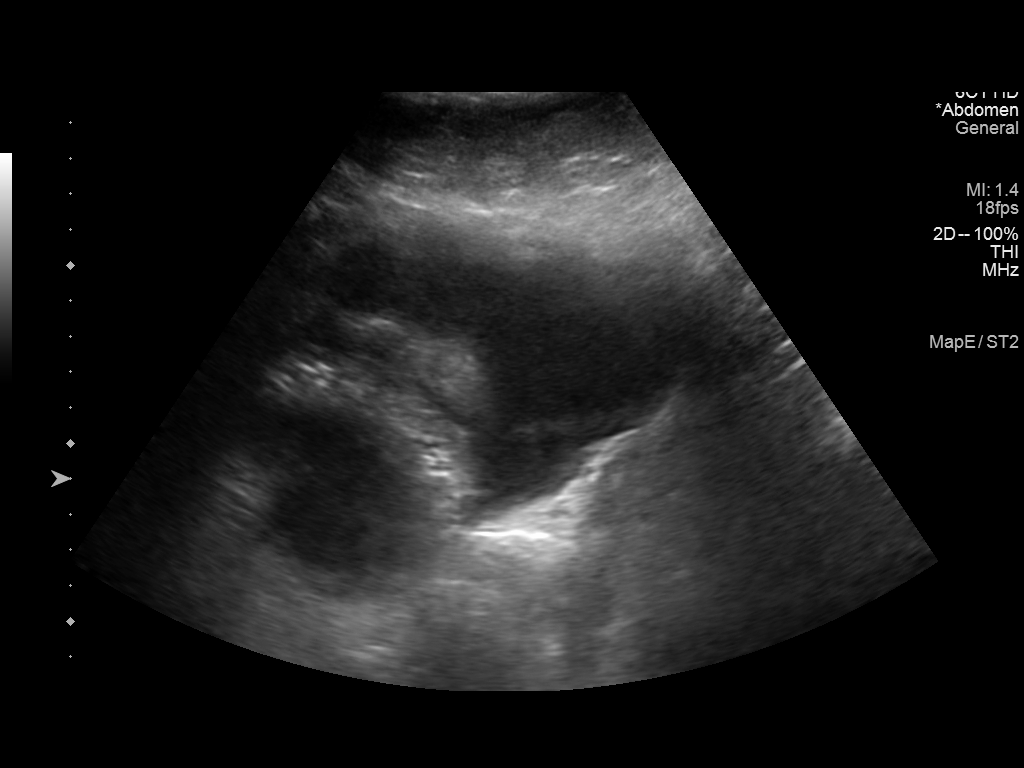
[im 4/4]
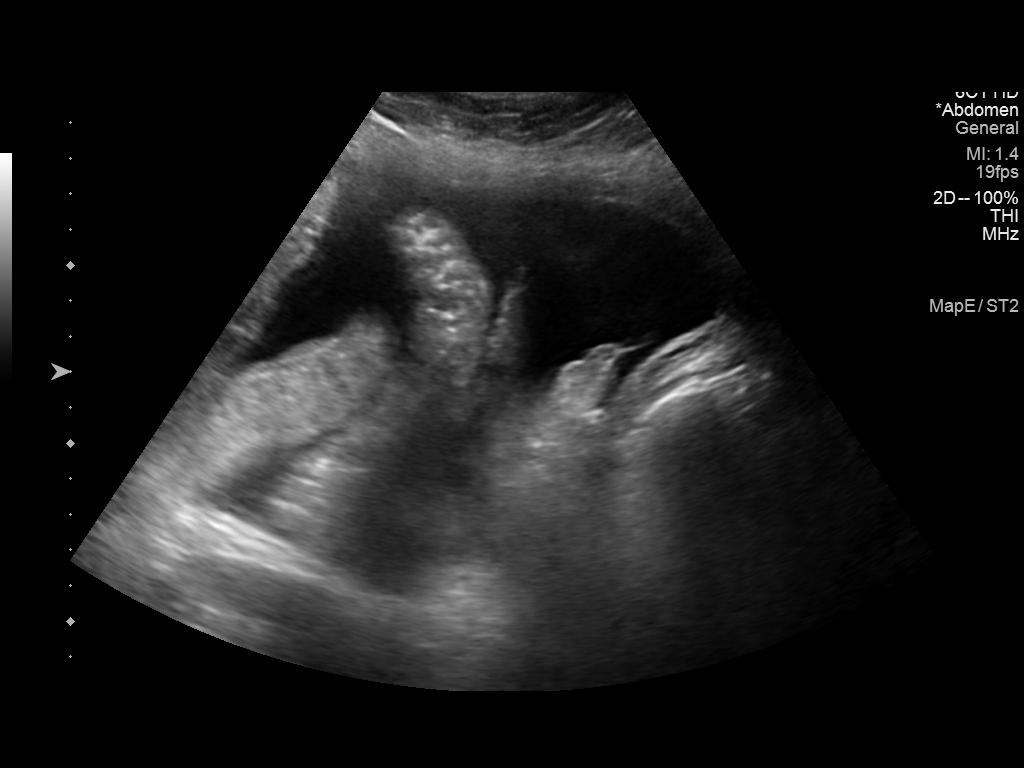

[4 of 4 positions shown; findings below may reference images not displayed]

EXAM:
ULTRASOUND GUIDED DIAGNOSTIC AND THERAPEUTIC PARACENTESIS

MEDICATIONS:
10 mL 1% lidocaine

COMPLICATIONS:
None immediate.

PROCEDURE:
Informed written consent was obtained from the patient after a
discussion of the risks, benefits and alternatives to treatment. A
timeout was performed prior to the initiation of the procedure.

Initial ultrasound scanning demonstrates a large amount of ascites
within the right lateral quadrant. The right lateral abdomen was
prepped and draped in the usual sterile fashion. 1% lidocaine was
used for local anesthesia.

Following this, a 19 gauge, 7-cm, Yueh catheter was introduced. An
ultrasound image was saved for documentation purposes. The
paracentesis was performed. The catheter was removed and a dressing
was applied. The patient tolerated the procedure well without
immediate post procedural complication.
FINDINGS: A total of approximately 1.0 liters of hazy, yellow fluid was
removed. Samples were sent to the laboratory as requested by the
clinical team.
IMPRESSION: Successful ultrasound-guided diagnostic and therapeutic paracentesis
yielding 1.0 liters of peritoneal fluid.

## 2019-05-07 IMAGING — US US ART/VEN ABD/PELV/SCROTUM DOPPLER LTD
1 series · 14 of 25 positions shown · non-contrast
Comparison: None.

CLINICAL DATA: Anasarca, cirrhosis.

EXAM:
DUPLEX ULTRASOUND OF LIVER
TECHNIQUE: Color and duplex Doppler ultrasound was performed to evaluate the
hepatic in-flow and out-flow vessels.

[Series 1: us art/ven abd/pelv/scrotum doppler ltd · 0.26mm/px · 14 of 27 slices shown]
[im 1/27]
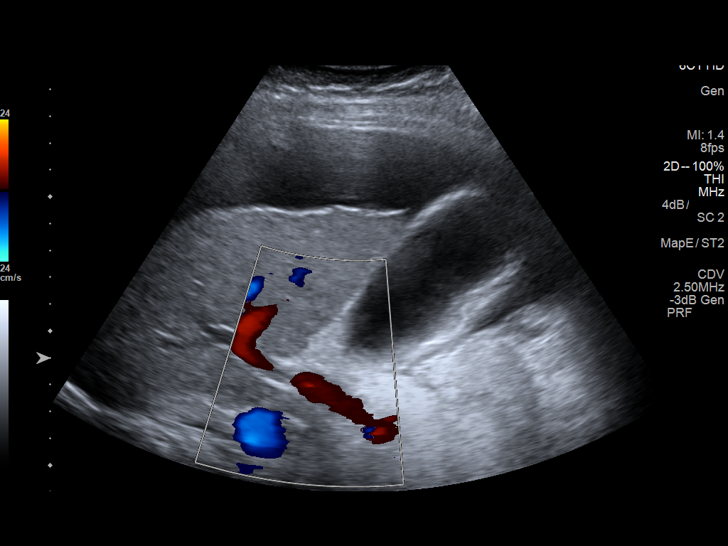
[im 3/27]
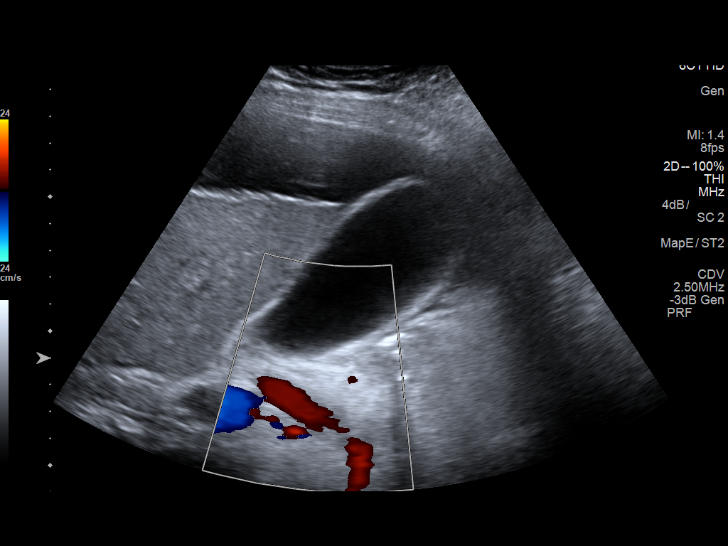
[im 5/27]
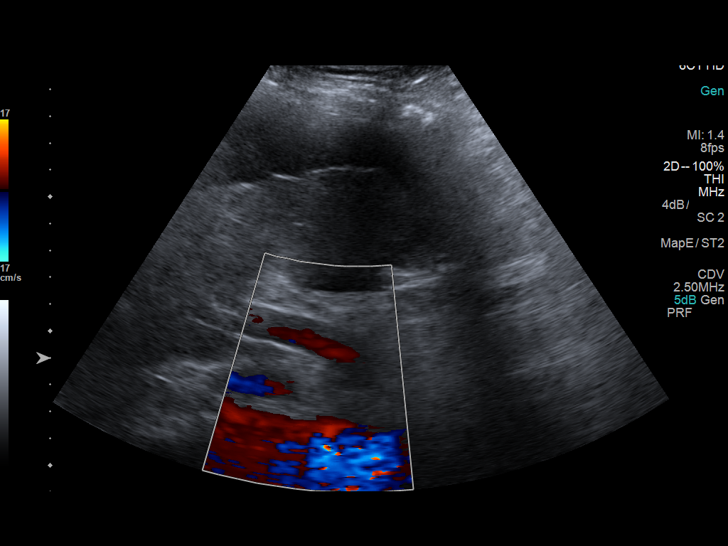
[im 7/27]
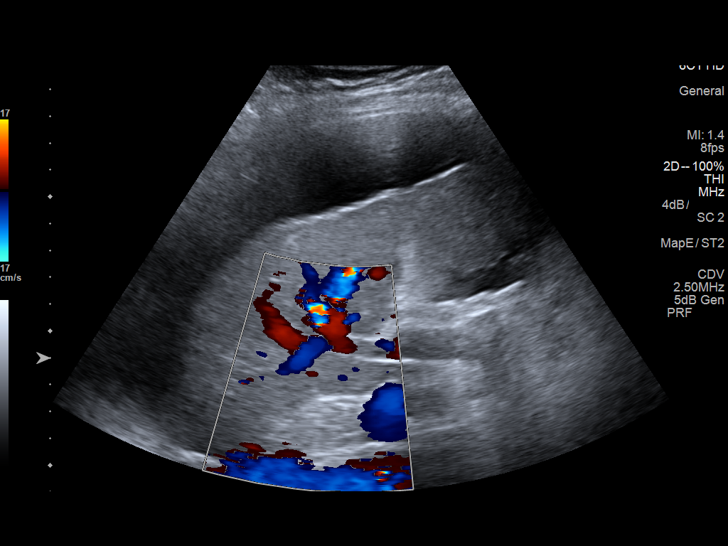
[im 9/27]
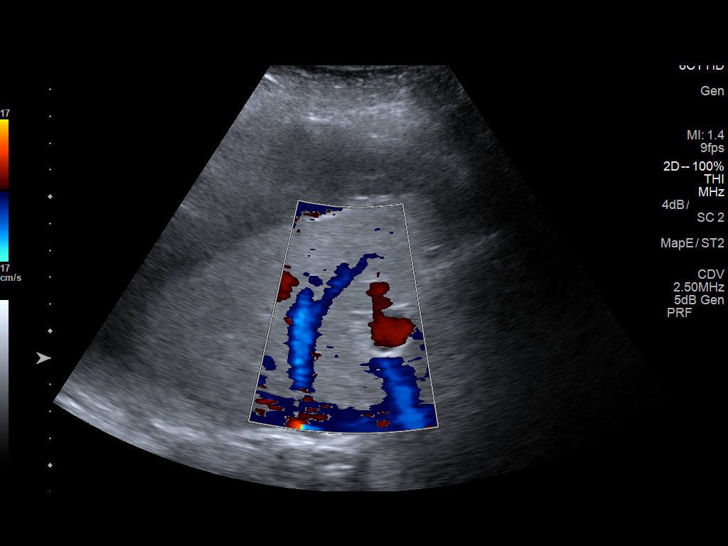
[im 10/27]
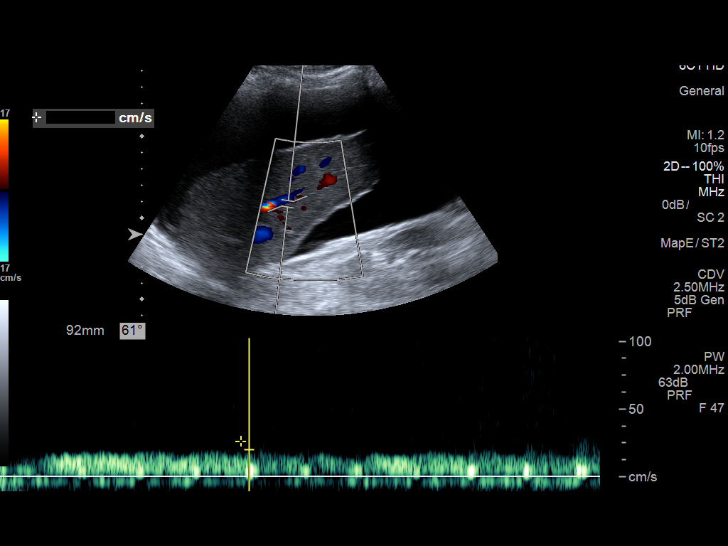
[im 12/27]
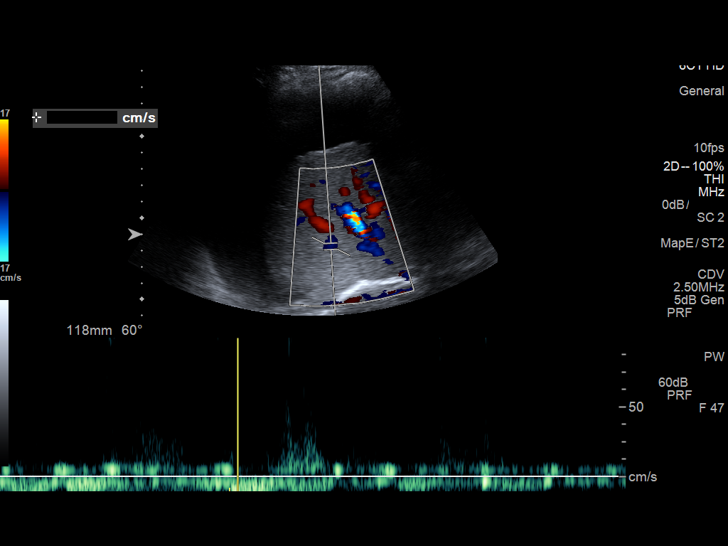
[im 15/27]
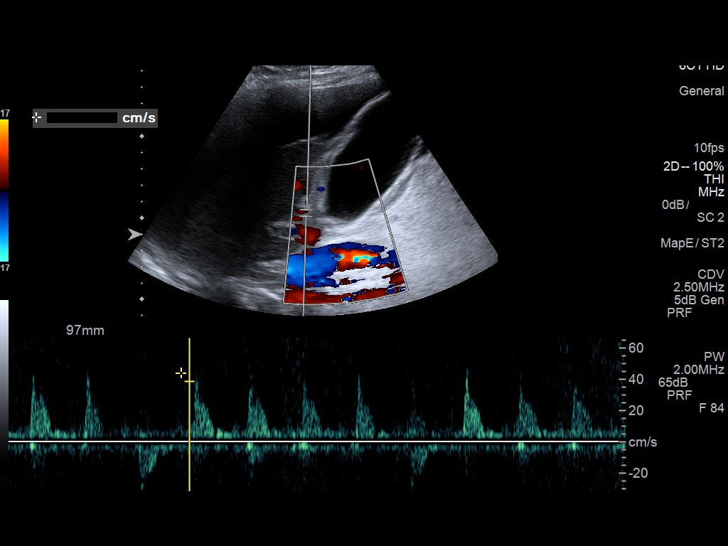
[im 17/27]
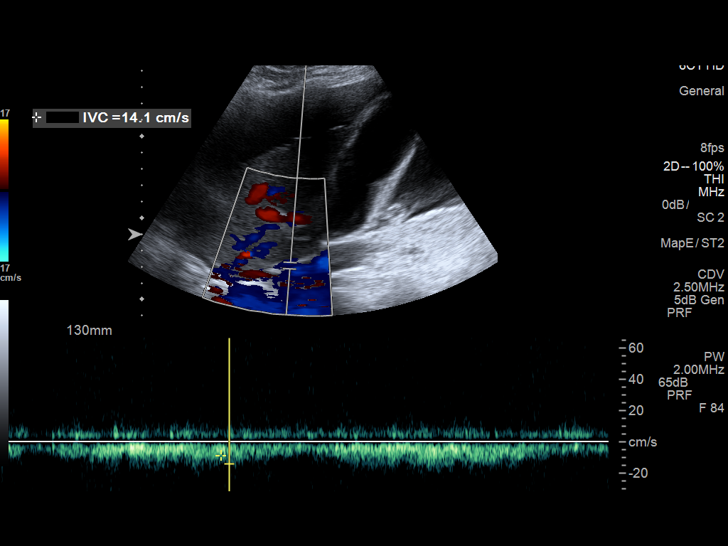
[im 18/27]
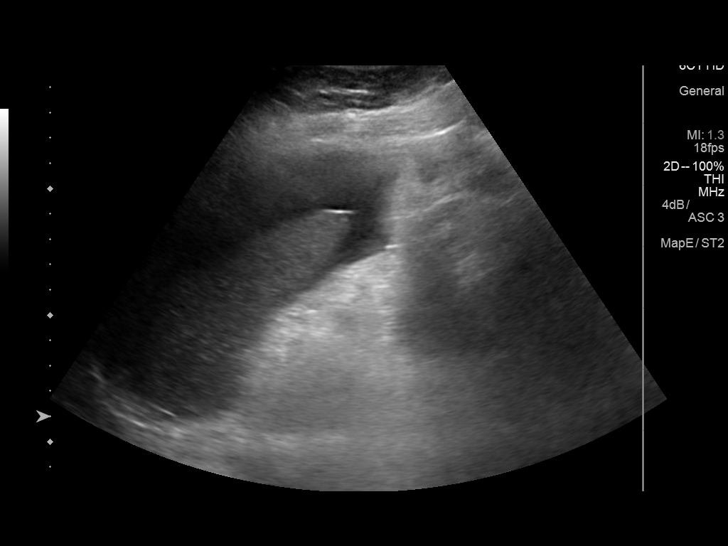
[im 20/27]
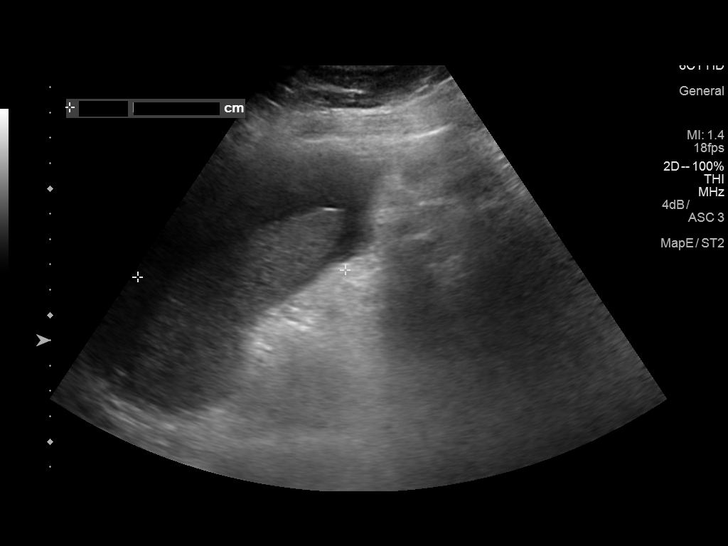
[im 22/27]
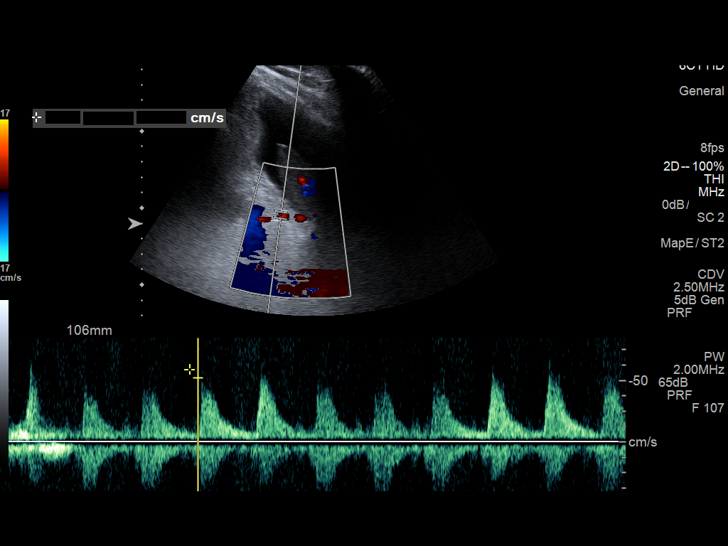
[im 24/27]
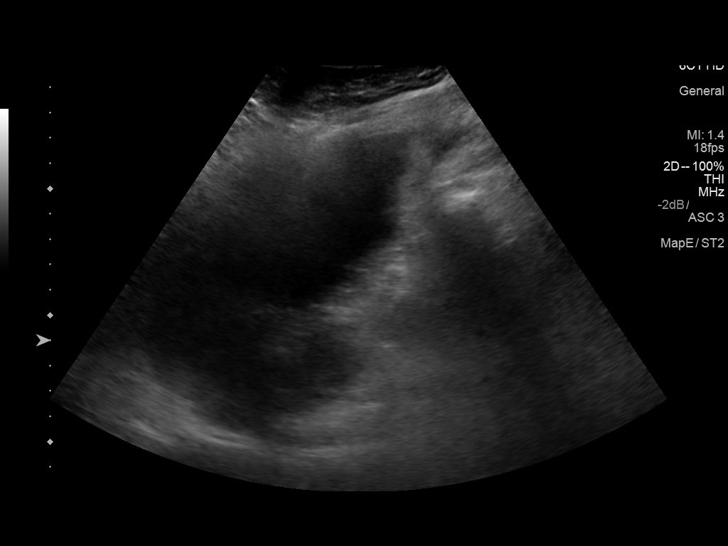
[im 27/27]
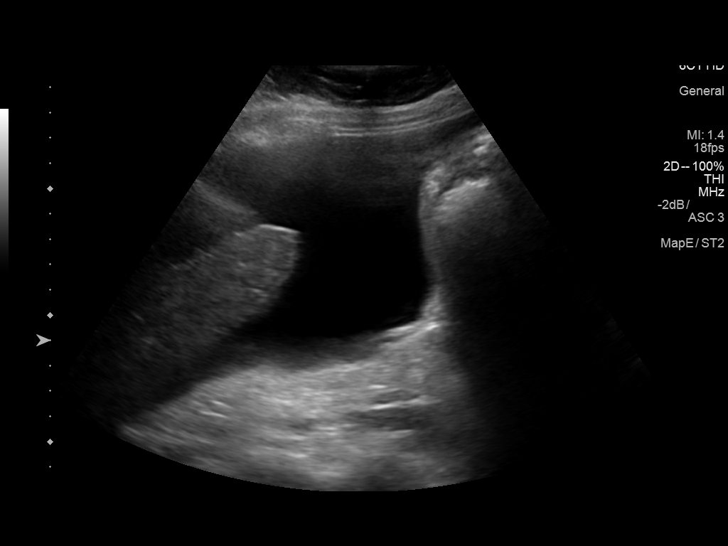

[14 of 25 positions shown; findings below may reference images not displayed]

FINDINGS: Portal Vein Velocities, all hepatopetal

Main: proximal portal vein 23.1 cm/second, mid 24.7 cm/second and
distal 14.3 cm/seconds

Right:  18.8 cm/sec

Left:  19.9 cm/sec

Hepatic Vein Velocities, all hepatofugal

Right:  17.8 cm/sec

Middle:  27.4 cm/sec

Left:  7.6 cm/sec

Intrahepatic IVC: Patent with velocity of 14.1 cm/second

Hepatic Artery Velocity:  38.6 cm/sec

Splenic Vein Velocity:  7.7 cm/sec

Varices: Absent

Ascites: Moderate to large volume of ascites with cirrhosis of the
liver.
IMPRESSION: Hepatopetal flow without thrombosis of the portal vein. Moderate to
large amount of ascites noted with cirrhotic appearing liver.
